# Patient Record
Sex: Female | Born: 1955 | Race: White | Hispanic: No | Marital: Married | Smoking: Former smoker
Health system: Southern US, Community
[De-identification: ages and names within clinical notes are randomized; demographics above are authoritative.]

## PROBLEM LIST (undated history)

## (undated) DIAGNOSIS — R011 Cardiac murmur, unspecified: Secondary | ICD-10-CM

## (undated) DIAGNOSIS — E785 Hyperlipidemia, unspecified: Secondary | ICD-10-CM

## (undated) DIAGNOSIS — IMO0001 Reserved for inherently not codable concepts without codable children: Secondary | ICD-10-CM

## (undated) DIAGNOSIS — Z5189 Encounter for other specified aftercare: Secondary | ICD-10-CM

## (undated) DIAGNOSIS — R51 Headache: Secondary | ICD-10-CM

## (undated) DIAGNOSIS — F32A Depression, unspecified: Secondary | ICD-10-CM

## (undated) DIAGNOSIS — N189 Chronic kidney disease, unspecified: Secondary | ICD-10-CM

## (undated) DIAGNOSIS — Z87442 Personal history of urinary calculi: Secondary | ICD-10-CM

## (undated) DIAGNOSIS — C801 Malignant (primary) neoplasm, unspecified: Secondary | ICD-10-CM

## (undated) DIAGNOSIS — M1990 Unspecified osteoarthritis, unspecified site: Secondary | ICD-10-CM

## (undated) DIAGNOSIS — I341 Nonrheumatic mitral (valve) prolapse: Secondary | ICD-10-CM

## (undated) DIAGNOSIS — I351 Nonrheumatic aortic (valve) insufficiency: Secondary | ICD-10-CM

## (undated) DIAGNOSIS — F329 Major depressive disorder, single episode, unspecified: Secondary | ICD-10-CM

## (undated) HISTORY — PX: EYE SURGERY: SHX253

## (undated) HISTORY — DX: Nonrheumatic mitral (valve) prolapse: I34.1

## (undated) HISTORY — DX: Depression, unspecified: F32.A

## (undated) HISTORY — DX: Hyperlipidemia, unspecified: E78.5

## (undated) HISTORY — PX: TONSILLECTOMY: SUR1361

## (undated) HISTORY — DX: Malignant (primary) neoplasm, unspecified: C80.1

## (undated) HISTORY — DX: Nonrheumatic aortic (valve) insufficiency: I35.1

## (undated) HISTORY — DX: Reserved for inherently not codable concepts without codable children: IMO0001

## (undated) HISTORY — DX: Personal history of urinary calculi: Z87.442

## (undated) HISTORY — DX: Major depressive disorder, single episode, unspecified: F32.9

## (undated) HISTORY — PX: BLEPHAROPLASTY: SUR158

## (undated) HISTORY — DX: Encounter for other specified aftercare: Z51.89

---

## 1984-08-20 HISTORY — PX: BREAST SURGERY: SHX581

## 1992-08-20 HISTORY — PX: BREAST SURGERY: SHX581

## 2007-06-11 ENCOUNTER — Ambulatory Visit (HOSPITAL_COMMUNITY): Admission: RE | Admit: 2007-06-11 | Discharge: 2007-06-11 | Payer: Self-pay | Admitting: Obstetrics and Gynecology

## 2007-10-06 ENCOUNTER — Emergency Department (HOSPITAL_COMMUNITY): Admission: EM | Admit: 2007-10-06 | Discharge: 2007-10-06 | Payer: Self-pay | Admitting: Emergency Medicine

## 2007-10-15 ENCOUNTER — Emergency Department (HOSPITAL_COMMUNITY): Admission: EM | Admit: 2007-10-15 | Discharge: 2007-10-16 | Payer: Self-pay | Admitting: Family Medicine

## 2007-10-17 ENCOUNTER — Ambulatory Visit: Payer: Self-pay | Admitting: Infectious Diseases

## 2007-10-17 DIAGNOSIS — B731 Onchocerciasis without eye disease: Secondary | ICD-10-CM | POA: Insufficient documentation

## 2007-10-17 DIAGNOSIS — B009 Herpesviral infection, unspecified: Secondary | ICD-10-CM | POA: Insufficient documentation

## 2007-10-17 DIAGNOSIS — D069 Carcinoma in situ of cervix, unspecified: Secondary | ICD-10-CM | POA: Insufficient documentation

## 2007-10-17 LAB — CONVERTED CEMR LAB
Basophils Absolute: 0 10*3/uL (ref 0.0–0.1)
Basophils Relative: 0 % (ref 0–1)
Eosinophils Absolute: 1.6 10*3/uL — ABNORMAL HIGH (ref 0.0–0.7)
Eosinophils Relative: 20 % — ABNORMAL HIGH (ref 0–5)
HCT: 40 % (ref 36.0–46.0)
Hemoglobin: 13.4 g/dL (ref 12.0–15.0)
Lymphocytes Relative: 30 % (ref 12–46)
Lymphs Abs: 2.4 10*3/uL (ref 0.7–4.0)
MCHC: 33.4 g/dL (ref 30.0–36.0)
MCV: 92 fL (ref 78.0–100.0)
Monocytes Absolute: 0.3 10*3/uL (ref 0.1–1.0)
Monocytes Relative: 4 % (ref 3–12)
Neutro Abs: 3.6 10*3/uL (ref 1.7–7.7)
Neutrophils Relative %: 46 % (ref 43–77)
Platelets: 320 10*3/uL (ref 150–400)
RBC: 4.35 M/uL (ref 3.87–5.11)
RDW: 13.3 % (ref 11.5–15.5)
WBC: 8 10*3/uL (ref 4.0–10.5)

## 2007-10-31 ENCOUNTER — Telehealth: Payer: Self-pay | Admitting: Infectious Diseases

## 2007-10-31 ENCOUNTER — Ambulatory Visit: Payer: Self-pay | Admitting: Surgery

## 2007-10-31 ENCOUNTER — Ambulatory Visit (HOSPITAL_COMMUNITY): Admission: RE | Admit: 2007-10-31 | Discharge: 2007-10-31 | Payer: Self-pay | Admitting: Internal Medicine

## 2007-10-31 ENCOUNTER — Encounter (INDEPENDENT_AMBULATORY_CARE_PROVIDER_SITE_OTHER): Payer: Self-pay | Admitting: Internal Medicine

## 2010-09-19 NOTE — Progress Notes (Signed)
Summary: pt. requesting tel. call from MD and f/u lab work  Phone Note Call from Patient Call back at cell [phone]   Caller: Patient Reason for Call: Talk to Doctor Summary of Call: Pt.  upset about not recieving a call back or email from MD to discuss lab results.  [place]. still in [place]  Pt. asking about f/u lab tests and whether she needs to be seen by MD.  She reports that her arm is still edematous.  She has approx. one week of medication remaining.  Please advise ASAP. Initial call taken by: Jennet Maduro RN,  October 31, 2007 12:16 PM  Follow-up for Phone Call        Dr Ninetta Lights will send pt email on Monday. I have scheduled pt return OV in the overflow clinic for November 14, 2007 @ 9:30 am. Pt has not been informed. Will wait until Dr Dorian Heckle speaks with pt.    Additional Follow-up for Phone Call Additional follow up Details #1::        attempted to call patient. no answer.  do not reschedule her for appt. as she has been abusive to staff on 2 different occasions

## 2010-09-19 NOTE — Assessment & Plan Note (Signed)
Summary: 2:00 jh nw pt recsincomputer l.arm swelling/kam   Chief Complaint:  woke up with arm pain 10/07/07.  History of Present Illness: 55 yo F with hx of L eye droopy and L arm feeling sleepy. She noticed 10-07-07  that her arm was swollen and that there was a lump on her L forearm (knot).  was seen in ER and had CT and MRI of brain both NL. Had a CBC showing a high Eosinophil count (19%). Was d/c home. 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  Was vaccinated for hepA/B/typhoid/diphtheria/rabies/yellow fever/mening vaccines. Took malaria prophylaxis.  10-16-07 arm was more swollen and now both sides swollen (prev dorsum only). now pruritic.  this has decreased since then. returned to ER (Eos now 16%).  states she is hypervigilant about washing her food, swims in fresh water as well as in [employer]. multiple insect bites.   states her arm si still swollen and still painful.   Currently living in [place] since October 2007.     Current Allergies (reviewed today): No known allergies    Family History:    mother recently deceased due to CAD/valve replacement. eating d/o.     Family History of Cervical cancer  Social History:    Married    Never Smoked    Alcohol use-no   Risk Factors:  Tobacco use:  never Passive smoke exposure:  no Caffeine use:  3 drinks per day Alcohol use:  no Exercise:  yes    Times per week:  2    Type:  gym Seatbelt use:  100 %   Review of Systems       no LAN, no change in BM, no cough or sob,  previous botox injections (has leakage from her supra-orbital area similar to this afterwards).    Vital Signs:  Patient Profile:   55 Years Old Female Height:     63 inches (160.02 cm) Weight:      120.4 pounds (54.73 kg) BMI:     21.40 Temp:     97.4 degrees F (36.33 degrees C) oral Pulse rate:   79 / minute BP sitting:   111 / 73  Pt. in pain?   yes    Location:   left arm    Intensity:    3    Type:       soreness  Vitals Entered By: Jennet Maduro RN (October 17, 2007 2:22 PM)              Is Patient Diabetic? No Nutritional Status BMI of 19 -24 = normal Nutritional Status Detail appetite "good"  Have you ever been in a relationship where you felt threatened, hurt or afraid?not asked sister present   Does patient need assistance? Functional Status Self care Ambulation Normal     Physical Exam  General:     well-developed, well-nourished, and well-hydrated.   Eyes:     pupils equal, pupils round, and pupils reactive to light.   Mouth:     pharynx pink and moist and no exudates.   Neck:     no masses.   Lungs:     normal respiratory effort and normal breath sounds.   Heart:     normal rate, regular rhythm, and no murmur.   Abdomen:     soft, non-tender, and normal bowel sounds.   Skin:     mild erythema on L forearm.  Cervical Nodes:  no anterior cervical adenopathy and no posterior cervical adenopathy.   Axillary Nodes:     no R axillary adenopathy and no L axillary adenopathy.      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 at this point this is an empiric dx. I will check a blood smear on her, and strongyloides as well as antibody test for filiarisis. I will start her on albendazole for the next 3 weeks. the recommended therapy would be to treat her with this medication or DEC however DEC is only available from the [facility]. she will need a repeat cbc at the end of this course. Since she is returning to [place] next week, I will be in touch with her via e-mail.   CC: Rodrigo Ran, MD Orders: T-CBC w/Diff [phone]) T- * Misc. Laboratory test [phone])   Complete Medication List: 1)  Prempro 0.625-2.5 Mg Tabs (Conj estrog-medroxyprogest ace) 2)  Ativan 1 Mg Tabs (Lorazepam) .... Take 1 tablet by mouth at bedtime 3)  Albenza 200 Mg Tabs (Albendazole) .... Take 1  tablet by mouth two times a day     ]

## 2010-09-19 NOTE — Miscellaneous (Signed)
Summary: Patient consent  Patient consent   Imported By: Louretta Parma 11/07/2007 16:51:26  _____________________________________________________________________  External Attachment:    Type:   Image     Comment:   External Document

## 2010-10-24 ENCOUNTER — Encounter (INDEPENDENT_AMBULATORY_CARE_PROVIDER_SITE_OTHER): Payer: Self-pay | Admitting: *Deleted

## 2010-10-31 NOTE — Letter (Signed)
Summary: Pre Visit Letter Revised  [facility]  [address] [place], [place] [address]   Phone: [phone]  Fax: [phone]        10/24/2010 MRN: [id number] [facility] [address] [place], [place]  [address]             Procedure Date:  12-11-10           Direct Colon--Dr. Juanda Chance   Welcome to the Gastroenterology Division at [facility].    You are scheduled to see a nurse for your pre-procedure visit on 11-27-10 at 8:30a.m. on the 3rd floor at [facility], [address]  We ask that you try to arrive at our office 15 minutes prior to your appointment time to allow for check-in.  Please take a minute to review the attached form.  If you answer "Yes" to one or more of the questions on the first page, we ask that you call the person listed at your earliest opportunity.  If you answer "No" to all of the questions, please complete the rest of the form and bring it to your appointment.    Your nurse visit will consist of discussing your medical and surgical history, your immediate family medical history, and your medications.   If you are unable to list all of your medications on the form, please bring the medication bottles to your appointment and we will list them.  We will need to be aware of both prescribed and over the counter drugs.  We will need to know exact dosage information as well.    Please be prepared to read and sign documents such as consent forms, a financial agreement, and acknowledgement forms.  If necessary, and with your consent, a friend or relative is welcome to sit-in on the nurse visit with you.  Please bring your insurance card so that we may make a copy of it.  If your insurance requires a referral to see a specialist, please bring your referral form from your primary care physician.  No co-pay is required for this nurse visit.     If you cannot keep your appointment, please call [phone] to cancel or reschedule prior to  your appointment date.  This allows [place] the opportunity to schedule an appointment for another patient in need of care.    Thank you for choosing [facility] for your medical needs.  We appreciate the opportunity to care for you.  Please visit [place] at our website  to learn more about our practice.  Sincerely, The Gastroenterology Division

## 2010-11-01 ENCOUNTER — Encounter: Payer: Self-pay | Admitting: *Deleted

## 2010-11-03 ENCOUNTER — Encounter: Payer: Self-pay | Admitting: Internal Medicine

## 2010-11-07 NOTE — Letter (Signed)
Summary: Moviprep Instructions  [facility]  [address]   [place], [place] [address]   Phone: [phone]  Fax: [phone]       Allison Guerrero    [birth year]    MRN: [id number]        Procedure Day Dorna Bloom: Lenor Coffin  11/09/10     Arrival Time: 12:30 p.m.     Procedure Time: 1:30 p.m.     Location of Procedure:                    x  [facility] (4th Floor)                        PREPARATION FOR COLONOSCOPY WITH MOVIPREP   Starting 5 days prior to your procedure 11-04-10 do not eat nuts, seeds, popcorn, corn, beans, peas,  salads, or any raw vegetables.  Do not take any fiber supplements (e.g. Metamucil, Citrucel, and Benefiber).  THE DAY BEFORE YOUR PROCEDURE         DATE: 11-08-10   DAY: WEDNESDAY  1.  Drink clear liquids the entire day-NO SOLID FOOD  2.  Do not drink anything colored red or purple.  Avoid juices with pulp.  No orange juice.  3.  Drink at least 64 oz. (8 glasses) of fluid/clear liquids during the day to prevent dehydration and help the prep work efficiently.  CLEAR LIQUIDS INCLUDE: Water Jello Ice Popsicles Tea (sugar ok, no milk/cream) Powdered fruit flavored drinks Coffee (sugar ok, no milk/cream) Gatorade Juice: apple, white grape, white cranberry  Lemonade Clear bullion, consomm, broth Carbonated beverages (any kind) Strained chicken noodle soup Hard Candy                             4.  In the morning, mix first dose of MoviPrep solution:    Empty 1 Pouch A and 1 Pouch B into the disposable container    Add lukewarm drinking water to the top line of the container. Mix to dissolve    Refrigerate (mixed solution should be used within 24 hrs)  5.  Begin drinking the prep at 5:00 p.m. The MoviPrep container is divided by 4 marks.   Every 15 minutes drink the solution down to the next mark (approximately 8 oz) until the full liter is complete.   6.  Follow completed prep with 16 oz of clear liquid of your  choice (Nothing red or purple).  Continue to drink clear liquids until bedtime.  7.  Before going to bed, mix second dose of MoviPrep solution:    Empty 1 Pouch A and 1 Pouch B into the disposable container    Add lukewarm drinking water to the top line of the container. Mix to dissolve    Refrigerate  THE DAY OF YOUR PROCEDURE      DATE: 11-09-10  DAY: THURSDAY  Beginning at 8:30AM  (5 hours before procedure):         1. Every 15 minutes, drink the solution down to the next mark (approx 8 oz) until the full liter is complete.  2. Follow completed prep with 16 oz. of clear liquid of your choice.    3. You may drink clear liquids until 11:30AM (2 HOURS BEFORE PROCEDURE).   MEDICATION INSTRUCTIONS  Unless otherwise instructed, you should take regular prescription medications with a small sip of water   as early as possible the morning  of your procedure.         OTHER INSTRUCTIONS  You will need a responsible adult at least 56 years of age to accompany you and drive you home.   This person must remain in the waiting room during your procedure.  Wear loose fitting clothing that is easily removed.  Leave jewelry and other valuables at home.  However, you may wish to bring a book to read or  an iPod/MP3 player to listen to music as you wait for your procedure to start.  Remove all body piercing jewelry and leave at home.  Total time from sign-in until discharge is approximately 2-3 hours.  You should go home directly after your procedure and rest.  You can resume normal activities the  day after your procedure.  The day of your procedure you should not:   Drive   Make legal decisions   Operate machinery   Drink alcohol   Return to work  You will receive specific instructions about eating, activities and medications before you leave.    The above instructions have been reviewed and explained to me by   [name] RN  November 03, 2010 8:55 AM     I fully  understand and can verbalize these instructions 55 _____________________________ Date _________

## 2010-11-07 NOTE — Miscellaneous (Signed)
Summary: [facility] Previsit/prep..  Clinical Lists Changes  Medications: Added new medication of MOVIPREP 100 GM  SOLR (PEG-KCL-NACL-NASULF-NA ASC-C) As per prep instructions. - Signed Rx of MOVIPREP 100 GM  SOLR (PEG-KCL-NACL-NASULF-NA ASC-C) As per prep instructions.;  #1 x 0;  Signed;  Entered by: [name] RN;  Authorized by: Hart Carwin MD;  Method used: Electronically to [facility] # [phone]*, [address], [place], [place]  [address], Ph: [phone], Fax: [phone] Observations: Added new observation of NKA: T (11/03/2010 8:22)    Prescriptions: MOVIPREP 100 GM  SOLR (PEG-KCL-NACL-NASULF-NA ASC-C) As per prep instructions.  #1 x 0   Entered by:   [name] RN   Authorized by:   Hart Carwin MD   Signed by:   [name] RN on 11/03/2010   Method used:   Electronically to        [facility] # [phone]* (retail)       [address]       [place], [place]  [address]       Ph: [phone]       Fax: [phone]   RxID:   [id number]

## 2010-11-08 ENCOUNTER — Encounter: Payer: Self-pay | Admitting: *Deleted

## 2010-11-09 ENCOUNTER — Encounter: Payer: Self-pay | Admitting: Internal Medicine

## 2010-11-09 ENCOUNTER — Ambulatory Visit (AMBULATORY_SURGERY_CENTER): Payer: Managed Care, Other (non HMO) | Admitting: Internal Medicine

## 2010-11-09 VITALS — BP 120/68 | HR 71 | Temp 97.5°F | Resp 20

## 2010-11-09 DIAGNOSIS — D126 Benign neoplasm of colon, unspecified: Secondary | ICD-10-CM

## 2010-11-09 DIAGNOSIS — K635 Polyp of colon: Secondary | ICD-10-CM

## 2010-11-09 DIAGNOSIS — Z1211 Encounter for screening for malignant neoplasm of colon: Secondary | ICD-10-CM

## 2010-11-09 NOTE — Patient Instructions (Signed)
Please review d/c instructions. 1-small polyp High Fiber diet Recall colonoscopy in 10 years

## 2010-11-10 ENCOUNTER — Telehealth: Payer: Self-pay | Admitting: *Deleted

## 2010-11-10 NOTE — Telephone Encounter (Signed)
No answer, left message

## 2010-11-14 ENCOUNTER — Other Ambulatory Visit: Payer: Self-pay | Admitting: Internal Medicine

## 2010-11-14 ENCOUNTER — Encounter: Payer: Self-pay | Admitting: Internal Medicine

## 2010-11-16 NOTE — Procedures (Addendum)
Summary: Colonoscopy  Patient: Allison Guerrero Note: All result statuses are Final unless otherwise noted.  Tests: (1) Colonoscopy (COL)   COL Colonoscopy           DONE     [facility]     [address]     [place], [place]  [address]          COLONOSCOPY PROCEDURE REPORT          PATIENT:  [name], [name]  MR#:  [id number]     BIRTHDATE:  Apr 15, 1956, 54 yrs. old  GENDER:  female     ENDOSCOPIST:  Hedwig Morton. Juanda Chance, MD     REF. BY:  Juline Patch, M.D.     PROCEDURE DATE:  11/09/2010     PROCEDURE:  Colonoscopy 19147     ASA CLASS:  Class I     INDICATIONS:  colorectal cancer screening, average risk     MEDICATIONS:   Versed 9 mg, Fentanyl 75 mcg          DESCRIPTION OF PROCEDURE:   After the risks benefits and     alternatives of the procedure were thoroughly explained, informed     consent was obtained.  Digital rectal exam was performed and     revealed no rectal masses.   The LB [id number] endoscope     was introduced through the anus and advanced to the cecum, which     was identified by both the appendix and ileocecal valve, without     limitations.  The quality of the prep was excellent, using     MoviPrep.  The instrument was then slowly withdrawn as the colon     was fully examined.     <<PROCEDUREIMAGES>>          FINDINGS:  A diminutive polyp was found. at 15 cm, 2mm polyp The     polyp was removed using cold biopsy forceps (see image5).  This     was otherwise a normal examination of the colon (see image4,     image3, image2, and image1).   Retroflexed views in the rectum     revealed no abnormalities.    The scope was then withdrawn from     the patient and the procedure completed.          COMPLICATIONS:  None     ENDOSCOPIC IMPRESSION:     1) Diminutive polyp     2) Otherwise normal examination     RECOMMENDATIONS:     1) Await pathology results     2) High fiber diet.     REPEAT EXAM:  In 10 year(s) for.       ______________________________     Hedwig Morton. Juanda Chance, MD          CC:          n.     eSIGNED:   Hedwig Morton. Maitri Schnoebelen at 11/09/2010 02:13 PM          Roger Kill, [id number]  Note: An exclamation mark (!) indicates a result that was not dispersed into the flowsheet. Document Creation Date: 11/15/2010 11:38 AM _______________________________________________________________________  (1) Order result status: Final Collection or observation date-time: 11/09/2010 14:03 Requested date-time:  Receipt date-time:  Reported date-time:  Referring Physician:   Ordering Physician: Lina Sar [phone]) Specimen Source:  Source: Launa Grill Order Number: [id number] Lab site:

## 2010-12-11 ENCOUNTER — Other Ambulatory Visit: Payer: Self-pay | Admitting: Internal Medicine

## 2011-05-11 LAB — CBC
HCT: 38.7
HCT: 41.1
Hemoglobin: 13.1
Hemoglobin: 13.7
MCHC: 33.4
MCHC: 33.9
MCV: 91.7
MCV: 91.8
Platelets: 331
Platelets: 339
RBC: 4.22
RBC: 4.48
RDW: 13.3
RDW: 13.7
WBC: 10.8 — ABNORMAL HIGH
WBC: 7.8

## 2011-05-11 LAB — DIFFERENTIAL
Basophils Absolute: 0
Basophils Absolute: 0
Basophils Relative: 0
Basophils Relative: 0
Eosinophils Absolute: 1.5 — ABNORMAL HIGH
Eosinophils Absolute: 1.7 — ABNORMAL HIGH
Eosinophils Relative: 16 — ABNORMAL HIGH
Eosinophils Relative: 19 — ABNORMAL HIGH
Lymphocytes Relative: 34
Lymphocytes Relative: 42
Lymphs Abs: 3.3
Lymphs Abs: 3.6
Monocytes Absolute: 0.4
Monocytes Absolute: 0.4
Monocytes Relative: 4
Monocytes Relative: 5
Neutro Abs: 2.7
Neutro Abs: 5.1
Neutrophils Relative %: 34 — ABNORMAL LOW
Neutrophils Relative %: 47

## 2011-05-11 LAB — I-STAT 8, (EC8 V) (CONVERTED LAB)
Acid-Base Excess: 1
Acid-base deficit: 1
BUN: 10
BUN: 26 — ABNORMAL HIGH
Bicarbonate: 21.8
Bicarbonate: 25.9 — ABNORMAL HIGH
Chloride: 105
Chloride: 107
Glucose, Bld: 123 — ABNORMAL HIGH
Glucose, Bld: 152 — ABNORMAL HIGH
HCT: 41
HCT: 59 — ABNORMAL HIGH
Hemoglobin: 13.9
Hemoglobin: 20.1 — ABNORMAL HIGH
Operator id: 294501
Operator id: 294501
Potassium: 3.7
Potassium: 3.8
Sodium: 136
Sodium: 141
TCO2: 23
TCO2: 27
pCO2, Ven: 27.7 — ABNORMAL LOW
pCO2, Ven: 49.3
pH, Ven: 7.328 — ABNORMAL HIGH
pH, Ven: 7.505 — ABNORMAL HIGH

## 2011-05-11 LAB — D-DIMER, QUANTITATIVE: D-Dimer, Quant: 0.25

## 2011-05-11 LAB — POCT I-STAT CREATININE
Creatinine, Ser: 0.7
Creatinine, Ser: 1.2
Operator id: 294501
Operator id: 294501

## 2011-08-21 HISTORY — PX: LITHOTRIPSY: SUR834

## 2011-08-21 HISTORY — PX: BREAST SURGERY: SHX581

## 2011-10-16 ENCOUNTER — Other Ambulatory Visit (HOSPITAL_COMMUNITY): Payer: Self-pay | Admitting: Internal Medicine

## 2011-10-16 DIAGNOSIS — Z1231 Encounter for screening mammogram for malignant neoplasm of breast: Secondary | ICD-10-CM

## 2012-03-03 ENCOUNTER — Other Ambulatory Visit: Payer: Self-pay | Admitting: Registered Nurse

## 2012-03-03 ENCOUNTER — Other Ambulatory Visit (HOSPITAL_COMMUNITY)
Admission: RE | Admit: 2012-03-03 | Discharge: 2012-03-03 | Disposition: A | Discharge status: Home / Self Care | Payer: 59 | Source: Ambulatory Visit | Attending: Internal Medicine | Admitting: Internal Medicine

## 2012-03-03 DIAGNOSIS — Z01419 Encounter for gynecological examination (general) (routine) without abnormal findings: Secondary | ICD-10-CM | POA: Insufficient documentation

## 2012-10-06 ENCOUNTER — Other Ambulatory Visit: Payer: Self-pay | Admitting: Orthopedic Surgery

## 2012-10-06 DIAGNOSIS — M542 Cervicalgia: Secondary | ICD-10-CM

## 2012-10-06 DIAGNOSIS — M25512 Pain in left shoulder: Secondary | ICD-10-CM

## 2012-10-07 ENCOUNTER — Ambulatory Visit
Admission: RE | Admit: 2012-10-07 | Discharge: 2012-10-07 | Disposition: A | Discharge status: Home / Self Care | Payer: 59 | Source: Ambulatory Visit | Attending: Orthopedic Surgery | Admitting: Orthopedic Surgery

## 2012-10-07 DIAGNOSIS — M542 Cervicalgia: Secondary | ICD-10-CM

## 2012-10-07 DIAGNOSIS — M25512 Pain in left shoulder: Secondary | ICD-10-CM

## 2013-03-24 ENCOUNTER — Other Ambulatory Visit: Payer: Self-pay

## 2013-03-24 DIAGNOSIS — Z01419 Encounter for gynecological examination (general) (routine) without abnormal findings: Secondary | ICD-10-CM | POA: Insufficient documentation

## 2013-03-26 ENCOUNTER — Other Ambulatory Visit: Payer: Self-pay | Admitting: Orthopedic Surgery

## 2013-04-13 ENCOUNTER — Other Ambulatory Visit: Payer: Self-pay | Admitting: Neurosurgery

## 2013-04-21 ENCOUNTER — Encounter (HOSPITAL_COMMUNITY): Payer: Self-pay

## 2013-04-22 ENCOUNTER — Other Ambulatory Visit (HOSPITAL_COMMUNITY)
Admission: RE | Admit: 2013-04-22 | Discharge: 2013-04-22 | Disposition: A | Discharge status: Home / Self Care | Payer: 59 | Source: Ambulatory Visit | Attending: Internal Medicine | Admitting: Internal Medicine

## 2013-04-23 ENCOUNTER — Inpatient Hospital Stay (HOSPITAL_COMMUNITY): Admission: RE | Admit: 2013-04-23 | Payer: 59 | Source: Ambulatory Visit

## 2013-04-27 ENCOUNTER — Other Ambulatory Visit: Payer: Self-pay | Admitting: Orthopedic Surgery

## 2013-04-28 ENCOUNTER — Ambulatory Visit (HOSPITAL_COMMUNITY)
Admission: RE | Admit: 2013-04-28 | Discharge: 2013-04-28 | Disposition: A | Discharge status: Home / Self Care | Payer: 59 | Source: Ambulatory Visit | Attending: Orthopedic Surgery | Admitting: Orthopedic Surgery

## 2013-04-28 ENCOUNTER — Encounter (HOSPITAL_COMMUNITY): Payer: Self-pay

## 2013-04-28 ENCOUNTER — Encounter (HOSPITAL_COMMUNITY)
Admission: RE | Admit: 2013-04-28 | Discharge: 2013-04-28 | Disposition: A | Discharge status: Home / Self Care | Payer: 59 | Source: Ambulatory Visit | Attending: Orthopedic Surgery | Admitting: Orthopedic Surgery

## 2013-04-28 DIAGNOSIS — Z01812 Encounter for preprocedural laboratory examination: Secondary | ICD-10-CM | POA: Insufficient documentation

## 2013-04-28 DIAGNOSIS — E785 Hyperlipidemia, unspecified: Secondary | ICD-10-CM | POA: Insufficient documentation

## 2013-04-28 DIAGNOSIS — Z01818 Encounter for other preprocedural examination: Secondary | ICD-10-CM | POA: Insufficient documentation

## 2013-04-28 DIAGNOSIS — M5030 Other cervical disc degeneration, unspecified cervical region: Secondary | ICD-10-CM | POA: Insufficient documentation

## 2013-04-28 DIAGNOSIS — Z01810 Encounter for preprocedural cardiovascular examination: Secondary | ICD-10-CM | POA: Insufficient documentation

## 2013-04-28 HISTORY — DX: Unspecified osteoarthritis, unspecified site: M19.90

## 2013-04-28 HISTORY — DX: Cardiac murmur, unspecified: R01.1

## 2013-04-28 HISTORY — DX: Chronic kidney disease, unspecified: N18.9

## 2013-04-28 HISTORY — DX: Headache: R51

## 2013-04-28 LAB — URINALYSIS, ROUTINE W REFLEX MICROSCOPIC
Bilirubin Urine: NEGATIVE
Glucose, UA: NEGATIVE mg/dL
Hgb urine dipstick: NEGATIVE
Ketones, ur: NEGATIVE mg/dL
Leukocytes, UA: NEGATIVE
Nitrite: NEGATIVE
Protein, ur: NEGATIVE mg/dL
Specific Gravity, Urine: 1.011 (ref 1.005–1.030)
Urobilinogen, UA: 0.2 mg/dL (ref 0.0–1.0)
pH: 7 (ref 5.0–8.0)

## 2013-04-28 LAB — COMPREHENSIVE METABOLIC PANEL
ALT: 17 U/L (ref 0–35)
AST: 18 U/L (ref 0–37)
Albumin: 3.8 g/dL (ref 3.5–5.2)
Alkaline Phosphatase: 50 U/L (ref 39–117)
BUN: 12 mg/dL (ref 6–23)
CO2: 25 mEq/L (ref 19–32)
Calcium: 9.5 mg/dL (ref 8.4–10.5)
Chloride: 108 mEq/L (ref 96–112)
Creatinine, Ser: 0.82 mg/dL (ref 0.50–1.10)
GFR calc Af Amer: 90 mL/min — ABNORMAL LOW (ref >90)
GFR calc non Af Amer: 77 mL/min — ABNORMAL LOW (ref >90)
Glucose, Bld: 66 mg/dL — ABNORMAL LOW (ref 70–99)
Potassium: 4 mEq/L (ref 3.5–5.1)
Sodium: 142 mEq/L (ref 135–145)
Total Bilirubin: <0.1 mg/dL — ABNORMAL LOW (ref 0.3–1.2)
Total Protein: 6.5 g/dL (ref 6.0–8.3)

## 2013-04-28 LAB — CBC WITH DIFFERENTIAL/PLATELET
Basophils Absolute: 0.1 10*3/uL (ref 0.0–0.1)
Basophils Relative: 1 % (ref 0–1)
Eosinophils Absolute: 0.3 10*3/uL (ref 0.0–0.7)
Eosinophils Relative: 5 % (ref 0–5)
HCT: 39.4 % (ref 36.0–46.0)
Hemoglobin: 13.6 g/dL (ref 12.0–15.0)
Lymphocytes Relative: 36 % (ref 12–46)
Lymphs Abs: 2.1 10*3/uL (ref 0.7–4.0)
MCH: 31.6 pg (ref 26.0–34.0)
MCHC: 34.5 g/dL (ref 30.0–36.0)
MCV: 91.4 fL (ref 78.0–100.0)
Monocytes Absolute: 0.2 10*3/uL (ref 0.1–1.0)
Monocytes Relative: 4 % (ref 3–12)
Neutro Abs: 3.1 10*3/uL (ref 1.7–7.7)
Neutrophils Relative %: 55 % (ref 43–77)
Platelets: 297 10*3/uL (ref 150–400)
RBC: 4.31 MIL/uL (ref 3.87–5.11)
RDW: 13.4 % (ref 11.5–15.5)
WBC: 5.7 10*3/uL (ref 4.0–10.5)

## 2013-04-28 LAB — TYPE AND SCREEN
ABO/RH(D): A POS
Antibody Screen: NEGATIVE

## 2013-04-28 LAB — SURGICAL PCR SCREEN
MRSA, PCR: NEGATIVE
Staphylococcus aureus: POSITIVE — AB

## 2013-04-28 LAB — APTT: aPTT: 29 seconds (ref 24–37)

## 2013-04-28 LAB — PROTIME-INR
INR: 1.09 (ref 0.00–1.49)
Prothrombin Time: 13.9 seconds (ref 11.6–15.2)

## 2013-04-28 LAB — ABO/RH: ABO/RH(D): A POS

## 2013-04-28 MED ORDER — CEFAZOLIN SODIUM-DEXTROSE 2-3 GM-% IV SOLR
2.0000 g | INTRAVENOUS | Status: AC
  Administered 2013-04-29: 2 g via INTRAVENOUS
  Filled 2013-04-28: qty 50

## 2013-04-28 NOTE — Pre-Procedure Instructions (Signed)
Allison Guerrero  04/28/2013     Your procedure is scheduled on:  Wednesday April 29, 2013  Report to [facility] at 0630 AM.  Call this number if you have problems the morning of surgery: [phone]   Remember:   Do not eat food or drink liquids after midnight.   Take these medicines the morning of surgery with A SIP OF WATER:  Hydrocodone-Acetaminophen if needed for pain   Do not wear jewelry, make-up or nail polish.  Do not wear lotions, powders, or perfumes. You may wear deodorant.  Do not shave 48 hours prior to surgery. Men may shave face and neck.  Do not bring valuables to the hospital.  [facility] is not responsible for any belongings or valuables.  Contacts, dentures or bridgework may not be worn into surgery.  Leave suitcase in the car. After surgery it may be brought to your room.  For patients admitted to the hospital, checkout time is 11:00 AM the day of  discharge.   Patients discharged the day of surgery will not be allowed to drive home.  Name an  Special Instructions: Incentive Spirometry - Practice and bring it with you on the day of surgery. Shower using CHG 2 nights before surgery and the night before surgery.  If you shower the day of surgery use CHG.  Use special wash - you have one bottle of CHG for all showers.  You should use approximately 1/3 of the bottle for each shower.   Please read over the following fact sheets that you were given: Pain Booklet, Coughing and Deep Breathing, Blood Transfusion Information, MRSA Information and Surgical Site Infection Prevention

## 2013-04-29 ENCOUNTER — Encounter (HOSPITAL_COMMUNITY): Payer: Self-pay | Admitting: Vascular Surgery

## 2013-04-29 ENCOUNTER — Encounter (HOSPITAL_COMMUNITY): Payer: Self-pay | Admitting: Certified Registered Nurse Anesthetist

## 2013-04-29 ENCOUNTER — Inpatient Hospital Stay (HOSPITAL_COMMUNITY)
Admission: RE | Admit: 2013-04-29 | Discharge: 2013-05-01 | DRG: 473 | Disposition: A | Discharge status: Home Health | Payer: 59 | Source: Ambulatory Visit | Attending: Orthopedic Surgery | Admitting: Orthopedic Surgery

## 2013-04-29 ENCOUNTER — Encounter (HOSPITAL_COMMUNITY)
Admission: RE | Disposition: A | Discharge status: Home Health | Payer: Self-pay | Source: Ambulatory Visit | Attending: Orthopedic Surgery

## 2013-04-29 ENCOUNTER — Ambulatory Visit (HOSPITAL_COMMUNITY): Payer: 59

## 2013-04-29 ENCOUNTER — Ambulatory Visit (HOSPITAL_COMMUNITY): Payer: 59 | Admitting: Certified Registered Nurse Anesthetist

## 2013-04-29 DIAGNOSIS — Z79899 Other long term (current) drug therapy: Secondary | ICD-10-CM

## 2013-04-29 DIAGNOSIS — I059 Rheumatic mitral valve disease, unspecified: Secondary | ICD-10-CM | POA: Diagnosis present

## 2013-04-29 DIAGNOSIS — M1990 Unspecified osteoarthritis, unspecified site: Secondary | ICD-10-CM | POA: Diagnosis present

## 2013-04-29 DIAGNOSIS — F3289 Other specified depressive episodes: Secondary | ICD-10-CM | POA: Diagnosis present

## 2013-04-29 DIAGNOSIS — F411 Generalized anxiety disorder: Secondary | ICD-10-CM | POA: Diagnosis present

## 2013-04-29 DIAGNOSIS — M5030 Other cervical disc degeneration, unspecified cervical region: Principal | ICD-10-CM | POA: Diagnosis present

## 2013-04-29 DIAGNOSIS — Z87891 Personal history of nicotine dependence: Secondary | ICD-10-CM

## 2013-04-29 DIAGNOSIS — F329 Major depressive disorder, single episode, unspecified: Secondary | ICD-10-CM | POA: Diagnosis present

## 2013-04-29 DIAGNOSIS — E785 Hyperlipidemia, unspecified: Secondary | ICD-10-CM | POA: Diagnosis present

## 2013-04-29 DIAGNOSIS — Z8541 Personal history of malignant neoplasm of cervix uteri: Secondary | ICD-10-CM

## 2013-04-29 HISTORY — PX: ANTERIOR CERVICAL DECOMP/DISCECTOMY FUSION: SHX1161

## 2013-04-29 SURGERY — ANTERIOR CERVICAL DECOMPRESSION/DISCECTOMY FUSION 2 LEVELS
Anesthesia: General | Site: Neck | Wound class: Clean

## 2013-04-29 MED ORDER — MUPIROCIN 2 % EX OINT
TOPICAL_OINTMENT | CUTANEOUS | Status: AC
  Filled 2013-04-29: qty 22

## 2013-04-29 MED ORDER — PRAVASTATIN SODIUM 40 MG PO TABS
80.0000 mg | ORAL_TABLET | Freq: Every day | ORAL | Status: DC
  Administered 2013-05-01: 80 mg via ORAL
  Filled 2013-04-29 (×3): qty 2

## 2013-04-29 MED ORDER — LACTATED RINGERS IV SOLN
INTRAVENOUS | Status: DC | PRN
  Administered 2013-04-29 (×2): via INTRAVENOUS

## 2013-04-29 MED ORDER — PHENOL 1.4 % MT LIQD: 1.0000 | OROMUCOSAL | Status: DC | PRN

## 2013-04-29 MED ORDER — PHENYLEPHRINE HCL 10 MG/ML IJ SOLN
INTRAMUSCULAR | Status: DC | PRN
  Administered 2013-04-29: 40 ug via INTRAVENOUS

## 2013-04-29 MED ORDER — GLYCOPYRROLATE 0.2 MG/ML IJ SOLN
INTRAMUSCULAR | Status: DC | PRN
  Administered 2013-04-29: 0.6 mg via INTRAVENOUS

## 2013-04-29 MED ORDER — CHLORHEXIDINE GLUCONATE CLOTH 2 % EX PADS
6.0000 | MEDICATED_PAD | Freq: Every day | CUTANEOUS | Status: DC
  Administered 2013-04-30: 6 via TOPICAL

## 2013-04-29 MED ORDER — MORPHINE SULFATE 2 MG/ML IJ SOLN
1.0000 mg | INTRAMUSCULAR | Status: DC | PRN
Start: 2013-04-29 — End: 2013-05-01
  Administered 2013-04-29 (×2): 2 mg via INTRAVENOUS
  Filled 2013-04-29 (×2): qty 1

## 2013-04-29 MED ORDER — OXYCODONE-ACETAMINOPHEN 5-325 MG PO TABS
1.0000 | ORAL_TABLET | ORAL | Status: DC | PRN
  Administered 2013-04-29: 2 via ORAL
  Administered 2013-04-30: 1 via ORAL
  Administered 2013-04-30 (×3): 2 via ORAL
  Administered 2013-04-30: 1 via ORAL
  Administered 2013-05-01: 2 via ORAL
  Filled 2013-04-29: qty 2
  Filled 2013-04-29: qty 1
  Filled 2013-04-29 (×5): qty 2

## 2013-04-29 MED ORDER — NON FORMULARY: Freq: Every day | Status: DC

## 2013-04-29 MED ORDER — OXYCODONE HCL 5 MG PO TABS
5.0000 mg | ORAL_TABLET | Freq: Once | ORAL | Status: DC | PRN
Start: 2013-04-29 — End: 2013-04-29

## 2013-04-29 MED ORDER — BUPIVACAINE-EPINEPHRINE 0.25% -1:200000 IJ SOLN
INTRAMUSCULAR | Status: DC | PRN
  Administered 2013-04-29: 3 mL

## 2013-04-29 MED ORDER — SUMATRIPTAN SUCCINATE 100 MG PO TABS: 100.0000 mg | ORAL_TABLET | ORAL | Status: DC | PRN

## 2013-04-29 MED ORDER — FENTANYL CITRATE 0.05 MG/ML IJ SOLN
INTRAMUSCULAR | Status: DC | PRN
  Administered 2013-04-29 (×2): 25 ug via INTRAVENOUS
  Administered 2013-04-29: 50 ug via INTRAVENOUS
  Administered 2013-04-29: 25 ug via INTRAVENOUS
  Administered 2013-04-29: 100 ug via INTRAVENOUS
  Administered 2013-04-29: 25 ug via INTRAVENOUS

## 2013-04-29 MED ORDER — SODIUM CHLORIDE 0.9 % IJ SOLN: 3.0000 mL | INTRAMUSCULAR | Status: DC | PRN

## 2013-04-29 MED ORDER — NEOSTIGMINE METHYLSULFATE 1 MG/ML IJ SOLN
INTRAMUSCULAR | Status: DC | PRN
  Administered 2013-04-29: 3 mg via INTRAVENOUS

## 2013-04-29 MED ORDER — POVIDONE-IODINE 7.5 % EX SOLN: Freq: Once | CUTANEOUS | Status: DC

## 2013-04-29 MED ORDER — HYDROMORPHONE HCL PF 1 MG/ML IJ SOLN
0.2500 mg | INTRAMUSCULAR | Status: DC | PRN
  Administered 2013-04-29: 0.5 mg via INTRAVENOUS

## 2013-04-29 MED ORDER — ACETAMINOPHEN 650 MG RE SUPP: 650.0000 mg | RECTAL | Status: DC | PRN

## 2013-04-29 MED ORDER — CALCIUM-VITAMIN D 500-200 MG-UNIT PO TABS
1.0000 | ORAL_TABLET | Freq: Two times a day (BID) | ORAL | Status: DC
  Administered 2013-04-30 – 2013-05-01 (×2): 1 via ORAL
  Filled 2013-04-29 (×6): qty 1

## 2013-04-29 MED ORDER — SENNOSIDES-DOCUSATE SODIUM 8.6-50 MG PO TABS: 1.0000 | ORAL_TABLET | Freq: Every evening | ORAL | Status: DC | PRN

## 2013-04-29 MED ORDER — TOPIRAMATE 25 MG PO TABS
50.0000 mg | ORAL_TABLET | Freq: Every day | ORAL | Status: DC
  Administered 2013-04-29 – 2013-05-01 (×3): 50 mg via ORAL
  Filled 2013-04-29 (×3): qty 2

## 2013-04-29 MED ORDER — DOCUSATE SODIUM 100 MG PO CAPS
100.0000 mg | ORAL_CAPSULE | Freq: Two times a day (BID) | ORAL | Status: DC
  Administered 2013-04-29 – 2013-05-01 (×5): 100 mg via ORAL
  Filled 2013-04-29 (×6): qty 1

## 2013-04-29 MED ORDER — SODIUM CHLORIDE 0.9 % IJ SOLN
3.0000 mL | Freq: Two times a day (BID) | INTRAMUSCULAR | Status: DC
Start: 2013-04-29 — End: 2013-05-01

## 2013-04-29 MED ORDER — BUPIVACAINE-EPINEPHRINE PF 0.25-1:200000 % IJ SOLN
INTRAMUSCULAR | Status: AC
  Filled 2013-04-29: qty 30

## 2013-04-29 MED ORDER — FLEET ENEMA 7-19 GM/118ML RE ENEM: 1.0000 | ENEMA | Freq: Once | RECTAL | Status: AC | PRN

## 2013-04-29 MED ORDER — LIDOCAINE HCL (CARDIAC) 20 MG/ML IV SOLN
INTRAVENOUS | Status: DC | PRN
  Administered 2013-04-29: 80 mg via INTRAVENOUS

## 2013-04-29 MED ORDER — CEFAZOLIN SODIUM 1-5 GM-% IV SOLN
1.0000 g | Freq: Three times a day (TID) | INTRAVENOUS | Status: AC
  Administered 2013-04-29 (×2): 1 g via INTRAVENOUS
  Filled 2013-04-29 (×2): qty 50

## 2013-04-29 MED ORDER — ROCURONIUM BROMIDE 100 MG/10ML IV SOLN
INTRAVENOUS | Status: DC | PRN
  Administered 2013-04-29: 10 mg via INTRAVENOUS
  Administered 2013-04-29: 50 mg via INTRAVENOUS

## 2013-04-29 MED ORDER — ALUM & MAG HYDROXIDE-SIMETH 200-200-20 MG/5ML PO SUSP: 30.0000 mL | Freq: Four times a day (QID) | ORAL | Status: DC | PRN

## 2013-04-29 MED ORDER — EPHEDRINE SULFATE 50 MG/ML IJ SOLN
INTRAMUSCULAR | Status: DC | PRN
  Administered 2013-04-29 (×3): 10 mg via INTRAVENOUS

## 2013-04-29 MED ORDER — ZOLPIDEM TARTRATE 5 MG PO TABS: 5.0000 mg | ORAL_TABLET | Freq: Every evening | ORAL | Status: DC | PRN

## 2013-04-29 MED ORDER — LORAZEPAM 1 MG PO TABS
2.0000 mg | ORAL_TABLET | Freq: Every day | ORAL | Status: DC
  Administered 2013-04-29 – 2013-04-30 (×2): 2 mg via ORAL
  Filled 2013-04-29 (×2): qty 2

## 2013-04-29 MED ORDER — ONDANSETRON HCL 4 MG/2ML IJ SOLN
INTRAMUSCULAR | Status: DC | PRN
  Administered 2013-04-29: 4 mg via INTRAVENOUS

## 2013-04-29 MED ORDER — VILAZODONE HCL 20 MG PO TABS
20.0000 mg | ORAL_TABLET | Freq: Every day | ORAL | Status: DC
  Administered 2013-04-30: 20 mg via ORAL
  Filled 2013-04-29 (×3): qty 1

## 2013-04-29 MED ORDER — ONDANSETRON HCL 4 MG/2ML IJ SOLN
4.0000 mg | INTRAMUSCULAR | Status: DC | PRN
  Filled 2013-04-29: qty 2

## 2013-04-29 MED ORDER — HYDROMORPHONE HCL PF 1 MG/ML IJ SOLN
INTRAMUSCULAR | Status: AC
  Filled 2013-04-29: qty 1

## 2013-04-29 MED ORDER — PROPOFOL 10 MG/ML IV BOLUS
INTRAVENOUS | Status: DC | PRN
  Administered 2013-04-29: 150 mg via INTRAVENOUS

## 2013-04-29 MED ORDER — PRAMIPEXOLE DIHYDROCHLORIDE 1 MG PO TABS
1.0000 mg | ORAL_TABLET | Freq: Every day | ORAL | Status: DC
  Administered 2013-04-29 – 2013-04-30 (×2): 1 mg via ORAL
  Filled 2013-04-29 (×3): qty 1

## 2013-04-29 MED ORDER — ACETAMINOPHEN 325 MG PO TABS: 650.0000 mg | ORAL_TABLET | ORAL | Status: DC | PRN

## 2013-04-29 MED ORDER — ONDANSETRON HCL 4 MG/2ML IJ SOLN: 4.0000 mg | Freq: Four times a day (QID) | INTRAMUSCULAR | Status: DC | PRN

## 2013-04-29 MED ORDER — SODIUM CHLORIDE 0.9 % IV SOLN: 250.0000 mL | INTRAVENOUS | Status: DC

## 2013-04-29 MED ORDER — DIAZEPAM 5 MG PO TABS
5.0000 mg | ORAL_TABLET | Freq: Four times a day (QID) | ORAL | Status: DC | PRN
  Administered 2013-04-29 – 2013-05-01 (×4): 5 mg via ORAL
  Filled 2013-04-29 (×5): qty 1

## 2013-04-29 MED ORDER — 0.9 % SODIUM CHLORIDE (POUR BTL) OPTIME
TOPICAL | Status: DC | PRN
  Administered 2013-04-29: 1000 mL

## 2013-04-29 MED ORDER — MUPIROCIN 2 % EX OINT
TOPICAL_OINTMENT | Freq: Two times a day (BID) | CUTANEOUS | Status: DC
  Administered 2013-04-29 – 2013-05-01 (×5): via NASAL
  Filled 2013-04-29: qty 22

## 2013-04-29 MED ORDER — MENTHOL 3 MG MT LOZG: 1.0000 | LOZENGE | OROMUCOSAL | Status: DC | PRN

## 2013-04-29 MED ORDER — BISACODYL 5 MG PO TBEC: 5.0000 mg | DELAYED_RELEASE_TABLET | Freq: Every day | ORAL | Status: DC | PRN

## 2013-04-29 MED ORDER — OXYCODONE HCL 5 MG/5ML PO SOLN: 5.0000 mg | Freq: Once | ORAL | Status: DC | PRN

## 2013-04-29 MED ORDER — MUPIROCIN 2 % EX OINT
TOPICAL_OINTMENT | Freq: Once | CUTANEOUS | Status: AC
  Administered 2013-04-29: 07:00:00 via NASAL
  Filled 2013-04-29: qty 22

## 2013-04-29 MED ORDER — THROMBIN 20000 UNITS EX SOLR
CUTANEOUS | Status: AC
  Filled 2013-04-29: qty 20000

## 2013-04-29 MED ORDER — THROMBIN 20000 UNITS EX SOLR
CUTANEOUS | Status: DC | PRN
  Administered 2013-04-29: 10:00:00 via TOPICAL

## 2013-04-29 SURGICAL SUPPLY — 83 items
APL SKNCLS STERI-STRIP NONHPOA (GAUZE/BANDAGES/DRESSINGS) ×1
BENZOIN TINCTURE PRP APPL 2/3 (GAUZE/BANDAGES/DRESSINGS) ×2 IMPLANT
BIT DRILL NEURO 2X3.1 SFT TUCH (MISCELLANEOUS) ×1 IMPLANT
BIT DRILL SKYLINE 12MM (BIT) IMPLANT
BLADE SURG 15 STRL LF DISP TIS (BLADE) ×1 IMPLANT
BLADE SURG 15 STRL SS (BLADE) ×2
BLADE SURG ROTATE 9660 (MISCELLANEOUS) ×1 IMPLANT
BUR MATCHSTICK NEURO 3.0 LAGG (BURR) IMPLANT
CAGE 16X6X14 ENDOSKEL MED (Orthopedic Implant) IMPLANT
CARTRIDGE OIL MAESTRO DRILL (MISCELLANEOUS) ×1 IMPLANT
CLOTH BEACON ORANGE TIMEOUT ST (SAFETY) ×2 IMPLANT
CLSR STERI-STRIP ANTIMIC 1/2X4 (GAUZE/BANDAGES/DRESSINGS) ×1 IMPLANT
COLLAR CERV LO CONTOUR FIRM DE (SOFTGOODS) IMPLANT
CORDS BIPOLAR (ELECTRODE) ×2 IMPLANT
COVER SURGICAL LIGHT HANDLE (MISCELLANEOUS) ×2 IMPLANT
CRADLE DONUT ADULT HEAD (MISCELLANEOUS) ×2 IMPLANT
DEVICE ENDSKLTN CRVCL 5MM-0SM (Orthopedic Implant) IMPLANT
DIFFUSER DRILL AIR PNEUMATIC (MISCELLANEOUS) ×2 IMPLANT
DRAIN JACKSON RD 7FR 3/32 (WOUND CARE) IMPLANT
DRAPE C-ARM 42X72 X-RAY (DRAPES) ×2 IMPLANT
DRAPE POUCH INSTRU U-SHP 10X18 (DRAPES) ×2 IMPLANT
DRAPE SURG 17X23 STRL (DRAPES) ×6 IMPLANT
DRILL BIT SKYLINE 12MM (BIT) ×2
DRILL NEURO 2X3.1 SOFT TOUCH (MISCELLANEOUS) ×2
DURAPREP 26ML APPLICATOR (WOUND CARE) ×2 IMPLANT
ELECT COATED BLADE 2.86 ST (ELECTRODE) ×2 IMPLANT
ELECT REM PT RETURN 9FT ADLT (ELECTROSURGICAL) ×2
ELECTRODE REM PT RTRN 9FT ADLT (ELECTROSURGICAL) ×1 IMPLANT
ENDOSKELETON CERVICAL 5MM-0SM (Orthopedic Implant) ×2 IMPLANT
ENDOSKELETON IMPLANT MED 6M-0 (Orthopedic Implant) ×2 IMPLANT
EVACUATOR SILICONE 100CC (DRAIN) IMPLANT
GAUZE SPONGE 4X4 16PLY XRAY LF (GAUZE/BANDAGES/DRESSINGS) ×2 IMPLANT
GLOVE BIO SURGEON STRL SZ7 (GLOVE) ×2 IMPLANT
GLOVE BIO SURGEON STRL SZ8 (GLOVE) ×2 IMPLANT
GLOVE BIOGEL PI IND STRL 7.5 (GLOVE) ×2 IMPLANT
GLOVE BIOGEL PI IND STRL 8 (GLOVE) ×1 IMPLANT
GLOVE BIOGEL PI INDICATOR 7.5 (GLOVE) ×2
GLOVE BIOGEL PI INDICATOR 8 (GLOVE) ×1
GLOVE BIOGEL PI ORTHO PRO SZ7 (GLOVE) ×1
GLOVE PI ORTHO PRO STRL SZ7 (GLOVE) IMPLANT
GLOVE SURG SS PI 7.0 STRL IVOR (GLOVE) ×1 IMPLANT
GOWN STRL NON-REIN LRG LVL3 (GOWN DISPOSABLE) ×2 IMPLANT
GOWN STRL REIN XL XLG (GOWN DISPOSABLE) ×3 IMPLANT
INTERLOCK LRDTC CRVCL VBR 6MM (Bone Implant) IMPLANT
IV CATH 14GX2 1/4 (CATHETERS) ×2 IMPLANT
KIT BASIN OR (CUSTOM PROCEDURE TRAY) ×2 IMPLANT
KIT ROOM TURNOVER OR (KITS) ×2 IMPLANT
LORDOTIC CERVICAL VBR 6MM SM (Bone Implant) ×2 IMPLANT
MANIFOLD NEPTUNE II (INSTRUMENTS) ×2 IMPLANT
NDL SPNL 20GX3.5 QUINCKE YW (NEEDLE) ×1 IMPLANT
NEEDLE 27GAX1X1/2 (NEEDLE) ×2 IMPLANT
NEEDLE SPNL 20GX3.5 QUINCKE YW (NEEDLE) ×2 IMPLANT
NS IRRIG 1000ML POUR BTL (IV SOLUTION) ×2 IMPLANT
OIL CARTRIDGE MAESTRO DRILL (MISCELLANEOUS) ×2
PACK ORTHO CERVICAL (CUSTOM PROCEDURE TRAY) ×2 IMPLANT
PAD ARMBOARD 7.5X6 YLW CONV (MISCELLANEOUS) ×4 IMPLANT
PATTIES SURGICAL .5 X.5 (GAUZE/BANDAGES/DRESSINGS) ×1 IMPLANT
PATTIES SURGICAL .5 X1 (DISPOSABLE) ×1 IMPLANT
PIN DISTRACTION 12MM (PIN) ×4
PIN DSTRCT 12XNS SS ACIS (PIN) IMPLANT
PIN TEMP SKYLINE THREADED (PIN) ×1 IMPLANT
PLATE SKYLINE 3LVL 45MM CERV (Plate) ×1 IMPLANT
PUTTY BONE DBX 5CC MIX (Putty) ×1 IMPLANT
SCREW SKYLINE VAR OS 14MM (Screw) ×2 IMPLANT
SCREW VAR SELF TAP SKYLINE 14M (Screw) ×6 IMPLANT
SPONGE GAUZE 4X4 12PLY (GAUZE/BANDAGES/DRESSINGS) ×2 IMPLANT
SPONGE INTESTINAL PEANUT (DISPOSABLE) ×2 IMPLANT
SPONGE SURGIFOAM ABS GEL 100 (HEMOSTASIS) ×2 IMPLANT
STRIP CLOSURE SKIN 1/2X4 (GAUZE/BANDAGES/DRESSINGS) ×2 IMPLANT
SURGIFLO TRUKIT (HEMOSTASIS) IMPLANT
SUT MNCRL AB 4-0 PS2 18 (SUTURE) ×2 IMPLANT
SUT SILK 4 0 (SUTURE)
SUT SILK 4-0 18XBRD TIE 12 (SUTURE) IMPLANT
SUT VIC AB 2-0 CT2 18 VCP726D (SUTURE) ×2 IMPLANT
SYR BULB IRRIGATION 50ML (SYRINGE) ×2 IMPLANT
SYR CONTROL 10ML LL (SYRINGE) ×4 IMPLANT
TAPE CLOTH 4X10 WHT NS (GAUZE/BANDAGES/DRESSINGS) ×1 IMPLANT
TAPE CLOTH SURG 4X10 WHT LF (GAUZE/BANDAGES/DRESSINGS) ×1 IMPLANT
TAPE UMBILICAL COTTON 1/8X30 (MISCELLANEOUS) ×2 IMPLANT
TOWEL OR 17X24 6PK STRL BLUE (TOWEL DISPOSABLE) ×2 IMPLANT
TOWEL OR 17X26 10 PK STRL BLUE (TOWEL DISPOSABLE) ×2 IMPLANT
WATER STERILE IRR 1000ML POUR (IV SOLUTION) ×2 IMPLANT
YANKAUER SUCT BULB TIP NO VENT (SUCTIONS) ×2 IMPLANT

## 2013-04-29 NOTE — H&P (Signed)
PREOPERATIVE H&P  Chief Complaint: R > L arm pain  HPI: Allison Guerrero is a 57 y.o. female who presents with ongoing pain in the left > right arm for almost 1 year. Patient failed conservative are including ESIs and PT and meds without relief. 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  Past Medical History  Diagnosis Date  . Cancer     cervical 20 yrs ago  . Blood transfusion     1983 r/t postpartum hemorrhage  . History of kidney stones     resolved with lithotripsy  . Mitral valve prolapse     very minor; no surgical intervention needed at this time  . Depression   . Hyperlipidemia   . Heart murmur   . Chronic kidney disease     kidney stones  . Headache(784.0)     migraines  . Arthritis    Past Surgical History  Procedure Laterality Date  . Tonsillectomy    . Lithotripsy  2013  . Breast surgery Bilateral 1986    implants  . Breast surgery Bilateral 1994    ruptured and replaced  . Breast surgery Bilateral 2013    lift and replaced with smaller breast  . Eye surgery    . Blepharoplasty Bilateral 45409   History   Social History  . Marital Status: Married    Spouse Name: N/A    Number of Children: N/A  . Years of Education: N/A   Social History Main Topics  . Smoking status: Former Smoker -- 1.00 packs/day for 20 years    Types: Cigarettes    Quit date: 04/29/2003  . Smokeless tobacco: None  . Alcohol Use: No  . Drug Use: No  . Sexual Activity: None   Other Topics Concern  . None   Social History Narrative  . None   Family History  Problem Relation Age of Onset  . Depression Mother   . Heart failure Mother   . Lung cancer Father   . Cervical cancer Sister    No Known Allergies Prior to Admission medications   Medication Sig Start Date End Date Taking? Authorizing Provider  Atorvastatin Calcium (LIPITOR PO) Take 1 tablet by mouth daily. Not sure of dose. Sample from md office   Yes Historical Provider, MD  Calcium Carbonate-Vitamin D (CALCIUM-VITAMIN D) 500-200  MG-UNIT per tablet Take 1 tablet by mouth 2 (two) times daily with a meal.     Yes Historical Provider, MD  estrogen, conjugated,-medroxyprogesterone (PREMPRO) 0.625-2.5 MG per tablet Take 1 tablet by mouth daily.     Yes Historical Provider, MD  HYDROcodone-acetaminophen (NORCO/VICODIN) 5-325 MG per tablet Take 1 tablet by mouth every 6 (six) hours as needed for pain (pain).   Yes Historical Provider, MD  LORazepam (ATIVAN) 2 MG tablet Take 2 mg by mouth at bedtime.     Yes Historical Provider, MD  pramipexole (MIRAPEX) 1 MG tablet Take 1 mg by mouth daily.   Yes Historical Provider, MD  topiramate (TOPAMAX) 50 MG tablet Take 50 mg by mouth daily.     Yes Historical Provider, MD  Vilazodone HCl (VIIBRYD) 20 MG TABS Take 20 mg by mouth daily.   Yes Historical Provider, MD  SUMAtriptan (IMITREX) 100 MG tablet Take 100 mg by mouth as needed.     Historical Provider, MD     All other systems have been reviewed and were otherwise negative with the exception of those mentioned in the HPI and as above.  Physical Exam: Filed Vitals:   04/29/13  0707  BP: 94/63  Pulse: 62  Temp: 97 F (36.1 C)  Resp: 16    General: Alert, no acute distress Cardiovascular: No pedal edema Respiratory: No cyanosis, no use of accessory musculature Skin: No lesions in the area of chief complaint Neurologic: Sensation intact distally Psychiatric: Patient is competent for consent with normal mood and affect Lymphatic: No axillary or cervical lymphadenopathy   Assessment/Plan: Left > right arm pain Plan for Procedure(s): ANTERIOR CERVICAL DECOMPRESSION/DISCECTOMY FUSION C4-7   Emilee Hero, MD 04/29/2013 8:09 AM

## 2013-04-29 NOTE — Anesthesia Procedure Notes (Signed)
Procedure Name: Intubation Date/Time: 04/29/2013 8:36 AM Performed by: Rogelia Boga Pre-anesthesia Checklist: Patient identified, Emergency Drugs available, Suction available, Patient being monitored and Timeout performed Patient Re-evaluated:Patient Re-evaluated prior to inductionOxygen Delivery Method: Circle system utilized Preoxygenation: Pre-oxygenation with 100% oxygen Intubation Type: IV induction Ventilation: Mask ventilation without difficulty Laryngoscope Size: Mac and 4 Grade View: Grade II Tube type: Oral Tube size: 7.0 mm Number of attempts: 1 Airway Equipment and Method: Stylet Placement Confirmation: ETT inserted through vocal cords under direct vision,  positive ETCO2 and breath sounds checked- equal and bilateral Secured at: 20 cm Tube secured with: Tape Dental Injury: Teeth and Oropharynx as per pre-operative assessment

## 2013-04-29 NOTE — Transfer of Care (Signed)
Immediate Anesthesia Transfer of Care Note  Patient: Temica Righetti  Procedure(s) Performed: Procedure(s) with comments: ANTERIOR CERVICAL DECOMPRESSION/DISCECTOMY FUSION 3 LEVELS (N/A) - Anterior cervical decompression fusion, cervical 4-5, cervical 5-6, cervical 6-7 wtih instrumentation and allograft  Patient Location: PACU  Anesthesia Type:General  Level of Consciousness: awake, alert , oriented and patient cooperative  Airway & Oxygen Therapy: Patient Spontanous Breathing and Patient connected to nasal cannula oxygen  Post-op Assessment: Report given to PACU RN, Post -op Vital signs reviewed and stable and Patient moving all extremities X 4  Post vital signs: Reviewed and stable  Complications: No apparent anesthesia complications

## 2013-04-29 NOTE — Progress Notes (Signed)
Orthopedic Tech Progress Note Patient Details:  Allison Guerrero Mar 30, 1956 [id number]  Ortho Devices Type of Ortho Device: [place] cervical collar Ortho Device/Splint Location: neck Ortho Device/Splint Interventions: Ordered Shower collar  Nikki Dom 04/29/2013, 2:57 PM

## 2013-04-29 NOTE — Anesthesia Postprocedure Evaluation (Signed)
Anesthesia Post Note  Patient: Allison Guerrero  Procedure(s) Performed: Procedure(s) (LRB): ANTERIOR CERVICAL DECOMPRESSION/DISCECTOMY FUSION 3 LEVELS (N/A)  Anesthesia type: General  Patient location: PACU  Post pain: Pain level controlled and Adequate analgesia  Post assessment: Post-op Vital signs reviewed, Patient's Cardiovascular Status Stable, Respiratory Function Stable, Patent Airway and Pain level controlled  Last Vitals:  Filed Vitals:   04/29/13 1245  BP: 126/67  Pulse:   Temp:   Resp:     Post vital signs: Reviewed and stable  Level of consciousness: awake, alert  and oriented  Complications: No apparent anesthesia complications

## 2013-04-29 NOTE — Preoperative (Signed)
Beta Blockers   Reason not to administer Beta Blockers:Not Applicable 

## 2013-04-29 NOTE — Anesthesia Preprocedure Evaluation (Addendum)
Anesthesia Evaluation  Patient identified by MRN, date of birth, ID band Patient awake    Reviewed: Allergy & Precautions, H&P , NPO status , Patient's Chart, lab work & pertinent test results  Airway Mallampati: II  Neck ROM: full    Dental  (+) Dental Advisory Given   Pulmonary former smoker,          Cardiovascular + Valvular Problems/Murmurs MVP     Neuro/Psych  Headaches, Depression    GI/Hepatic   Endo/Other    Renal/GU      Musculoskeletal  (+) Arthritis -, Osteoarthritis,    Abdominal   Peds  Hematology   Anesthesia Other Findings   Reproductive/Obstetrics                          Anesthesia Physical Anesthesia Plan  ASA: II  Anesthesia Plan: General   Post-op Pain Management:    Induction: Intravenous  Airway Management Planned: Oral ETT  Additional Equipment:   Intra-op Plan:   Post-operative Plan: Extubation in OR  Informed Consent: I have reviewed the patients History and Physical, chart, labs and discussed the procedure including the risks, benefits and alternatives for the proposed anesthesia with the patient or authorized representative who has indicated his/her understanding and acceptance.   Dental advisory given  Plan Discussed with: CRNA, Anesthesiologist and Surgeon  Anesthesia Plan Comments:        Anesthesia Quick Evaluation

## 2013-04-30 ENCOUNTER — Encounter (HOSPITAL_COMMUNITY): Payer: Self-pay

## 2013-04-30 ENCOUNTER — Encounter (HOSPITAL_COMMUNITY): Admission: RE | Payer: Self-pay | Source: Ambulatory Visit

## 2013-04-30 ENCOUNTER — Ambulatory Visit (HOSPITAL_COMMUNITY): Admission: RE | Admit: 2013-04-30 | Payer: 59 | Source: Ambulatory Visit | Admitting: Neurosurgery

## 2013-04-30 SURGERY — ANTERIOR CERVICAL DECOMPRESSION/DISCECTOMY FUSION 2 LEVELS
Anesthesia: General

## 2013-04-30 NOTE — Progress Notes (Signed)
UR COMPLETED  

## 2013-04-30 NOTE — Plan of Care (Signed)
Problem: Consults Goal: Diagnosis - Spinal Surgery Outcome: Completed/Met Date Met:  04/30/13 Cervical Spine Fusion     

## 2013-04-30 NOTE — Progress Notes (Signed)
Patient doing well. Minimal neck pain. Left arm pain resolved. Patient expresses concerns regarding going home, mainly related to stairs and her neck discomfort.   BP 89/54  Pulse 74  Temp(Src) 97.8 F (36.6 C) (Oral)  Resp 16  Ht 5\' 3"  (1.6 m)  Wt 49.896 kg (110 lb)  BMI 19.49 kg/m2  SpO2 100%  NVI -  Patient eating breakfast without difficulty Dressing and collar in place  POD #1 after C4-7 acdf  - patient seems to be ready for d/c home from a medical perspective, but expresses anxiety about stairs and her neck discomfort. I informed her that her neck discomfort with gradually lessen over time, but will not be significantly different today vs. tomorrow. I will order a PT evaluation and follow their recommendations regarding patient's safety at home.  - d/c home once clear by PT (today vs. tomorrow) - f/u 2 weeks

## 2013-04-30 NOTE — Op Note (Signed)
NAMEPORFIRIA, HEINRICH NO.:  [id number]  MEDICAL RECORD NO.:  [id number]  LOCATION:  [facility]                         FACILITY:  [facility]  PHYSICIAN:  Estill Bamberg, MD      DATE OF BIRTH:  [birth year]  DATE OF PROCEDURE:  04/29/2013                              OPERATIVE REPORT   PREOPERATIVE DIAGNOSES: 1. Bilateral cervical radiculopathy. 2. C4-C7 degenerative disk disease. 3. Bilateral neural foraminal stenosis to varying degrees involving C4-     5, C5-6, and C6-7.  POSTOPERATIVE DIAGNOSES: 1. Bilateral cervical radiculopathy. 2. C4-C7 degenerative disk disease. 3. Bilateral neural foraminal stenosis to varying degrees involving C4-     5, C5-6, and C6-7.  PROCEDURES: 1. Anterior cervical decompression and fusion, C4-5, C5-6, C6-7. 2. Placement of anterior instrumentation, C4-C5, C6-C7. 3. Insertion of interbody device x3 (Titan interbody spacers). 4. Use of morselized allograft. 5. Intraoperative use of fluoroscopy.  SURGEON:  Estill Bamberg, MD  ASSISTANT:  Jason Coop, PA-C.  ANESTHESIA:  General endotracheal anesthesia.  COMPLICATIONS:  None.  DISPOSITION:  Stable.  ESTIMATED BLOOD LOSS:  Minimal.  INDICATIONS FOR PROCEDURE:  Briefly, Ms. Knoop is a very pleasant 57- year-old female, who did initially present to me on March 02, 2013, with severe pain in her left and right arm.  Also of note, she does have what was thought to be intrinsic pathology involving the left shoulder.  I did review an MRI, which was clearly notable for moderate left-sided foraminal stenosis at C4-5, C5-6, as well as C6-7, as well as right- sided foraminal stenosis at C4-5 and C5-6.  The patient did undergo multiple forms of conservative care including physical therapy, and injections.  She obtained no relief with the conservative care rendered. We did discuss the risks and benefits regarding proceeding with a C4-C7 anterior cervical decompression and fusion with  instrumentation and allograft.  The patient did fully understand the risks and limitations of the procedure as outlined in my preoperative note.  OPERATIVE DETAILS:  On April 29, 2013, the patient was brought to surgery and general endotracheal anesthesia was administered.  The patient was placed supine on a hospital bed.  The neck was placed in a gentle degree of extension.  The patient's arms were secured to her sides.  The region of the ulnar nerves were padded, as were all bony prominences.  The neck was then prepped and draped in the usual sterile fashion and a time-out procedure was performed.  Antibiotics were given. I then made an incision from the midline to the medial border of the sternocleidomastoid muscle.  The platysma was identified and sharply incised.  The plane between the strap muscles medially and the sternocleidomastoid muscle laterally was identified and explored.  The anterior cervical spine was readily noted.  The carotid artery was retracted laterally.  I then obtained a lateral intraoperative fluoroscopic view to confirm the appropriate operative levels.  I then subperiosteally exposed the vertebral bodies of C4, C5, C6, and C7.  I then turned my attention towards the C6-7 interspace.  Caspar pins were placed into the C6 and C7 vertebral bodies and gentle distraction was applied.  Of note, self-retaining Shadow-Line retractor was also placed, centered  over the C6-7 interspace.  At this point, I did go forward with a diskectomy using a series of curettes and Kerrison punches and pituitary rongeur's.  The posterior longitudinal ligament was identified and taken down using a nerve hook.  I then used a #1 followed by #2 Kerrison to remove the posterior longitudinal ligament.  The right and the left neural foramina were thoroughly decompressed using a #2 Kerrison.  This was confirmed using a nerve hook.  I then prepared the endplates using curettes and I had  placed a series of trials.  I then selected an appropriate sized interbody spacer, which was packed with allograft and tamped into position under distraction.  Distraction was then discontinued from the Caspar pins and the Caspar pin from the C6 vertebral body was removed and bone wax was placed in its place.  This Caspar pin was then placed into the C5 vertebral body and distraction was applied.  A diskectomy was performed in the manner previously described.  Once again, a neural foraminal decompression was performed on both the right and the left sides.  The decompression was confirmed using a nerve hook bilaterally.  Again, the endplates were prepared once again and appropriate sized Titan interbody spacer was packed with allograft and tamped into position in the usual fashion.  Distraction was then discontinued and the Caspar pin was removed from the C6 vertebral body and placed into the C4 vertebral body.  Once again, distraction was applied, and a diskectomy was again performed.  Once again, the posterior longitudinal ligament was taken down and a thorough neural foraminal decompression was performed on the right and the left sides.  The decompression was confirmed using a nerve hook.  Once again, the endplates were prepared and an appropriate-sized interbody spacer was packed with allograft in the form of DBX mix, and tamped into position in the usual fashion.  I did obtain a lateral intraoperative fluoroscopic view, and was very pleased with the appearance of the construct.  I then chose an appropriate-sized anterior cervical plate, which was placed over the anterior cervical spine.  I did place two 14 mm variable angle screws in each vertebral body involving C4, C5, C6, and C7, for a total of 8 vertebral body screws.  I was happy with the purchase of each of the screws.  The can locking mechanism was then utilized, and these screws were locked to the plate.  The wound was  then copiously irrigated.  There was some minor areas of obvious bleeding which was uneventfully controlled using bipolar electrocautery.  I then obtained an AP and lateral fluoroscopic view and was very pleased with the appearance.  At this point, the platysma was closed using 2-0 Vicryl.  The skin was then closed using 3-0 Monocryl.  Benzoin and Steri- Strips were applied, followed by sterile dressing.  All instrument counts were correct at the termination of the procedure.  Of note, Jason Coop was my assistant throughout the entirety of the procedure, and did aid in essential retraction and suctioning needed throughout the entirety of the surgery.     Estill Bamberg, MD    MD/MEDQ  D:  04/29/2013  T:  04/29/2013  Job:  [id number]

## 2013-04-30 NOTE — Evaluation (Addendum)
Physical Therapy Evaluation Patient Details Name: Allison Guerrero MRN: [id number] DOB: Oct 29, 1955 Today's Date: 04/30/2013 Time: 4098-1191 PT Time Calculation (min): 18 min  PT Assessment / Plan / Recommendation History of Present Illness  s/p ACDF C4-C7  Clinical Impression  Patient is s/p ACDF C4-C7 POD#1 surgery resulting in functional limitations due to the deficits listed below (see PT Problem List). Patient will benefit from skilled PT to increase their independence and safety with mobility to allow discharge to the venue listed below. Pt very lethargic today and unsteady with gt. Pt unsteady to D/C home from mobility standpoint today. Will plan to address stairs in next session with husband present for family education.  May need cane or another AD to increase stability; pending on alertness.     PT Assessment  Patient needs continued PT services    Follow Up Recommendations  Home health PT;Supervision/Assistance - 24 hour    Does the patient have the potential to tolerate intense rehabilitation      Barriers to Discharge   pt has 2 story house; has to go to second floor which is 13 steps and no handrails    Equipment Recommendations  3in1 (PT)    Recommendations for Other Services     Frequency Min 5X/week    Precautions / Restrictions Precautions Precautions: Cervical Precaution Comments: pt given cervical precautions handout; educated on precautions; required min cues during session to maintain precautions Required Braces or Orthoses: Cervical Brace Cervical Brace: Hard collar;At all times Restrictions Weight Bearing Restrictions: No   Pertinent Vitals/Pain 6/10; BP 92/66 in standing       Mobility  Bed Mobility Bed Mobility: Supine to Sit;Sitting - Scoot to Edge of Bed Supine to Sit: 5: Supervision;HOB elevated;With rails Sitting - Scoot to Edge of Bed: 5: Supervision;With rail Details for Bed Mobility Assistance: pt performed bed mobility by coming long sit  to sit; encouraged pt to perform log rolling technique to decr pressure and strain on cervical spine; pt requried supervision for safety Transfers Transfers: Sit to Stand;Stand to Sit Sit to Stand: 4: Min guard;From bed Stand to Sit: 4: Min guard;To chair/3-in-1;With armrests Details for Transfer Assistance: min guard for safety; pt unsteady initially and very lethargic  Ambulation/Gait Ambulation/Gait Assistance: 4: Min assist Ambulation Distance (Feet): 60 Feet Assistive device: 1 person hand held assist Ambulation/Gait Assistance Details: pt unsteady with gt requiring handhel (A); pt falling asleep during amb; demo decr safety awarenes and required (A) around waist and with handheld support; pt amb with short shuffled gt  Gait Pattern: Decreased stride length;Shuffle;Narrow base of support Gait velocity: decr  Stairs: No (pt unsafe at this time) Naval architect Mobility: No         PT Diagnosis: Abnormality of gait;Acute pain  PT Problem List: Decreased balance;Decreased mobility;Decreased safety awareness;Pain PT Treatment Interventions: DME instruction;Gait training;Stair training;Functional mobility training;Therapeutic activities;Therapeutic exercise;Balance training;Neuromuscular re-education;Patient/family education     PT Goals(Current goals can be found in the care plan section) Acute Rehab PT Goals Patient Stated Goal: to go home tomorrow; i dont feel steady  PT Goal Formulation: With patient Time For Goal Achievement: 05/02/13 Potential to Achieve Goals: Good  Visit Information  Last PT Received On: 04/30/13 Assistance Needed: +1 History of Present Illness: s/p ACDF C4-C7       Prior Functioning  Home Living Family/patient expects to be discharged to:: Private residence Living Arrangements: Spouse/significant other Available Help at Discharge: Family;Available 24 hours/day Type of Home: Other(Comment) (Condo ) Home Access: Level entry  Home  Layout: Two level Alternate Level Stairs-Number of Steps: 13 Alternate Level Stairs-Rails: None Home Equipment: None Prior Function Level of Independence: Independent Communication Communication: No difficulties Dominant Hand: Left    Cognition  Cognition Arousal/Alertness: Lethargic;Suspect due to medications Behavior During Therapy: WFL for tasks assessed/performed Overall Cognitive Status: Within Functional Limits for tasks assessed Memory: Decreased recall of precautions;Decreased short-term memory    Extremity/Trunk Assessment Upper Extremity Assessment Upper Extremity Assessment: Overall WFL for tasks assessed Lower Extremity Assessment Lower Extremity Assessment: Overall WFL for tasks assessed Cervical / Trunk Assessment Cervical / Trunk Assessment: Normal   Balance Balance Balance Assessed: Yes Static Standing Balance Static Standing - Balance Support: Right upper extremity supported;During functional activity Static Standing - Level of Assistance: 4: Min assist Static Standing - Comment/# of Minutes: handheld (A) required to take BP while standing; pt unsteady and reports she feels off balance   End of Session PT - End of Session Equipment Utilized During Treatment: Cervical collar;Gait belt Activity Tolerance: Patient tolerated treatment well Patient left: in chair;with call bell/phone within reach;with family/visitor present Nurse Communication: Mobility status  GP     Donell Sievert,  [phone] 04/30/2013, 1:59 PM

## 2013-05-01 ENCOUNTER — Encounter (HOSPITAL_COMMUNITY): Payer: Self-pay | Admitting: Orthopedic Surgery

## 2013-05-01 MED ORDER — HYDROCODONE-ACETAMINOPHEN 7.5-325 MG PO TABS
1.0000 | ORAL_TABLET | Freq: Four times a day (QID) | ORAL | Status: DC | PRN
Start: 2013-05-01 — End: 2017-05-07

## 2013-05-01 MED ORDER — HYDROCODONE-ACETAMINOPHEN 7.5-325 MG PO TABS: 1.0000 | ORAL_TABLET | Freq: Four times a day (QID) | ORAL | Status: DC | PRN

## 2013-05-01 MED ORDER — ONDANSETRON HCL 4 MG PO TABS
4.0000 mg | ORAL_TABLET | Freq: Once | ORAL | Status: AC
  Administered 2013-05-01: 4 mg via ORAL
  Filled 2013-05-01: qty 1

## 2013-05-01 NOTE — Progress Notes (Signed)
05/01/13 Spoke with patient and her husband on 04/30/13 about HHC. Patient selected [facility] from [place] [facility] agencies list. [place], spoke with Burna Mortimer, set up HHPT start date 05/04/13. Faxed H and P, op note, PT eval and facesheet to [phone], received confirmation. Today contacted Darian with [facility] and requested 3N1 be delivered to patient prior to d/c. Jacquelynn Cree RN, BSN, CCM

## 2013-05-01 NOTE — Progress Notes (Signed)
05/01/13 Received call from [place] at [facility] that they are not in network for patient's insurance. Informed patient that [facility] would not be able to work with them. Patient stated to contact any agency in network.  Contacted [employer] at [phone] and was told [facility] is in network. Contacted Elijah Michaelis with [facility] and set up HHPT. Called patient and informed her and her husband that [facility] would be providing HHPT and gave them [facility]'s phone number if needed. Jacquelynn Cree RN, BSN, CCM

## 2013-05-01 NOTE — Progress Notes (Deleted)
Georga Bora, PA-C Physician Assistant Signed Orthopedics Progress Notes Service date: 05/01/2013 7:31 AM   Patient doing well. Minimal neck pain. Left arm pain resolved. L shoulder pain ongoing as expected. Pt to undergo PT for stair ambulation today. Reports N/V with pain medication this am, also moderately sedated on medications.  BP 111/61  Pulse 70  Temp(Src) 98.1 F (36.7 C) (Oral)  Resp 18  Ht 5\' 3"  (1.6 m)  Wt 49.896 kg (110 lb)  BMI 19.49 kg/m2  SpO2 100%  NVI, Patient eating breakfast without difficulty, Dressing CDI and collar in place  POD #2 after C4-7 acdf  - patient ready for d/c home from a medical perspective, will complete PT for stair ambulation today and  - d/c today after PT  - Cont Valium, percocet d/c'd and Norco 7.5 provided  - f/u 2 weeks

## 2013-05-01 NOTE — Progress Notes (Signed)
Patient doing well. Minimal neck pain. Left arm pain resolved. L shoulder pain ongoing as expected. Pt to undergo PT for stair ambulation today. Reports N/V with pain medication this am, also moderately sedated on medications.  BP 111/61  Pulse 70  Temp(Src) 98.1 F (36.7 C) (Oral)  Resp 18  Ht 5\' 3"  (1.6 m)  Wt 49.896 kg (110 lb)  BMI 19.49 kg/m2  SpO2 100%  NVI, Patient eating breakfast without difficulty, Dressing CDI and collar in place  POD #2 after C4-7 acdf   - patient ready for d/c home from a medical perspective, will complete PT for stair ambulation today and  - d/c today after PT  - Cont Valium, percocet d/c'd and Norco 7.5 provided  - f/u 2 weeks

## 2013-05-01 NOTE — Progress Notes (Signed)
Physical Therapy Treatment Patient Details Name: Allison Guerrero MRN: [id number] DOB: [birth year] Today's Date: 05/01/2013 Time: 4696-2952 PT Time Calculation (min): 24 min  PT Assessment / Plan / Recommendation  History of Present Illness s/p ACDF C4-C7   PT Comments   Pt continues to be unsteady with gt; does benefit from [facility] to increase stability. Encouraged pt to have 24/7 (A) with mobility upon D/C; pt agreeable. Pt was able to amb a flight of steps with SPC supporting Lt UE and handheld (A) in Rt UE. Pt is safe to D/C home with adequate (A) and AD for mobility. Pt to benefit from [facility] and 3 in 1 upon D/C and HHPT to ensure safe transition home.   Follow Up Recommendations  Home health PT;Supervision/Assistance - 24 hour     Does the patient have the potential to tolerate intense rehabilitation     Barriers to Discharge        Equipment Recommendations  3in1 (PT);Cane    Recommendations for Other Services    Frequency Min 5X/week   Progress towards PT Goals Progress towards PT goals: Progressing toward goals  Plan Current plan remains appropriate    Precautions / Restrictions Precautions Precautions: Cervical;Fall Precaution Comments: reviewed cervical precautions handout with pt; pt able to recall 2/3 independently; min cues during session to maintain   Required Braces or Orthoses: Cervical Brace Cervical Brace: Hard collar;At all times Restrictions Weight Bearing Restrictions: No   Pertinent Vitals/Pain 6/10 RN notified     Mobility  Bed Mobility Bed Mobility: Supine to Sit;Sitting - Scoot to Edge of Bed Supine to Sit: 5: Supervision;HOB elevated Sitting - Scoot to [place] of Bed: 5: Supervision Details for Bed Mobility Assistance: pt performed long sitting to EOB technique; reviewed log rolling technique with pt and pt verbalized understaning; however, she states that she prefers sleeping in reclined position and hopes husband will buy her one for at home; supervision  for safety and cues  Transfers Transfers: Sit to Stand;Stand to Sit Sit to Stand: 4: Min guard;From bed Stand to Sit: 5: Supervision;To bed;To chair/3-in-1;With armrests Details for Transfer Assistance: min guard for initial sit to stand; pt unsteady and weightshifting onto heels with posterior lean; cues for hand placement and safety with cane with transfers  Ambulation/Gait Ambulation/Gait Assistance: 4: Min guard Ambulation Distance (Feet): 200 Feet Assistive device: Straight cane Ambulation/Gait Assistance Details: pt continues to be unsteady with gt and amb with narrow BOS and scissor gt frequently; encouraged pt to incr BOS and upright posture for gt; cues for safety with SPC and min guard to steady primarily with dynamic gt changes (ie directional changes and speed changes)  Gait Pattern: Decreased stride length;Shuffle;Narrow base of support Gait velocity: decr  General Gait Details: pt encouraged to have 24/7 (A) when transferring/amb;pt verbalized understanding  Stairs: Yes Stairs Assistance: 4: Min guard Stair Management Technique: No rails;Step to pattern;With cane;Other (comment) (cane in Lt UE and handheld (A) in Rt UE ) Number of Stairs: 15 Wheelchair Mobility Wheelchair Mobility: No         PT Diagnosis:    PT Problem List:   PT Treatment Interventions:     PT Goals (current goals can now be found in the care plan section) Acute Rehab PT Goals Patient Stated Goal: to go home today and get a recliner to sleep in  PT Goal Formulation: With patient Time For Goal Achievement: 05/02/13 Potential to Achieve Goals: Good  Visit Information  Last PT Received On: 05/01/13 Assistance Needed: +  1 History of Present Illness: s/p ACDF C4-C7    Subjective Data  Subjective: Pt lying supine "i keep falling asleep, spilling stuff on me."  Patient Stated Goal: to go home today and get a recliner to sleep in    Cognition  Cognition Arousal/Alertness: Awake/alert Behavior  During Therapy: WFL for tasks assessed/performed Overall Cognitive Status: Within Functional Limits for tasks assessed    Balance  Balance Balance Assessed: Yes Static Standing Balance Static Standing - Balance Support: Left upper extremity supported;During functional activity Static Standing - Level of Assistance: 5: Stand by assistance  End of Session PT - End of Session Equipment Utilized During Treatment: Cervical collar;Gait belt Activity Tolerance: Patient tolerated treatment well Patient left: in chair;with call bell/phone within reach Nurse Communication: Mobility status;Other (comment) (DME needs for D/C )   GP     Donell Sievert, [place] [phone] 05/01/2013, 8:28 AM

## 2013-05-06 NOTE — Discharge Summary (Signed)
Patient ID: Allison Guerrero MRN: [id number] DOB/AGE: 09/19/1955 57 y.o.  Admit date: 04/29/2013 Discharge date: 05/01/2013  Admission Diagnoses: Radiculopathy   Discharge Diagnoses:  Same  Past Medical History  Diagnosis Date  . Cancer     cervical 20 yrs ago  . Blood transfusion     1983 r/t postpartum hemorrhage  . History of kidney stones     resolved with lithotripsy  . Mitral valve prolapse     very minor; no surgical intervention needed at this time  . Depression   . Hyperlipidemia   . Heart murmur   . Chronic kidney disease     kidney stones  . Headache(784.0)     migraines  . Arthritis     Surgeries: Procedure(s): ANTERIOR CERVICAL DECOMPRESSION/DISCECTOMY FUSION 3 LEVELS C4-7 on 04/29/2013    Discharged Condition: Improved  Hospital Course: Garry Sartwell is an 57 y.o. female who was admitted 04/29/2013 for operative treatment of radiculopathy. Patient has severe unremitting pain that affects sleep, daily activities, and work/hobbies. After pre-op clearance the patient was taken to the operating room on 04/29/2013 and underwent  Procedure(s): ANTERIOR CERVICAL DECOMPRESSION/DISCECTOMY FUSION 3 LEVELS C4-7.    Patient was given perioperative antibiotics:  Anti-infectives   Start     Dose/Rate Route Frequency Ordered Stop   04/29/13 1500  ceFAZolin (ANCEF) IVPB 1 g/50 mL premix     1 g 100 mL/hr over 30 Minutes Intravenous Every 8 hours 04/29/13 1442 04/29/13 2241   04/29/13 0600  ceFAZolin (ANCEF) IVPB 2 g/50 mL premix     2 g 100 mL/hr over 30 Minutes Intravenous On call to O.R. 04/28/13 1441 04/29/13 0845       Patient was given sequential compression devices, early ambulation to prevent DVT.  Patient benefited maximally from hospital stay and there were no complications.    Recent vital signs: BP 111/61  Pulse 70  Temp(Src) 98.1 F (36.7 C) (Oral)  Resp 18  Ht 5' 3" (1.6 m)  Wt 49.896 kg (110 lb)  BMI 19.49 kg/m2  SpO2 100%    Discharge  Medications:     Medication List    STOP taking these medications       HYDROcodone-acetaminophen 5-325 MG per tablet  Commonly known as:  NORCO/VICODIN  Replaced by:  HYDROcodone-acetaminophen 7.5-325 MG per tablet      TAKE these medications       calcium-vitamin D 500-200 MG-UNIT per tablet  Take 1 tablet by mouth 2 (two) times daily with a meal.     estrogen (conjugated)-medroxyprogesterone 0.625-2.5 MG per tablet  Commonly known as:  PREMPRO  Take 1 tablet by mouth daily.     HYDROcodone-acetaminophen 7.5-325 MG per tablet  Commonly known as:  NORCO  Take 1-2 tablets by mouth every 6 (six) hours as needed for pain.     LORazepam 2 MG tablet  Commonly known as:  ATIVAN  Take 2 mg by mouth at bedtime.     pramipexole 1 MG tablet  Commonly known as:  MIRAPEX  Take 1 mg by mouth daily.     pravastatin 80 MG tablet  Commonly known as:  PRAVACHOL  Take 80 mg by mouth daily.     SUMAtriptan 100 MG tablet  Commonly known as:  IMITREX  Take 100 mg by mouth as needed.     TOPAMAX 50 MG tablet  Generic drug:  topiramate  Take 50 mg by mouth daily.     VIIBRYD 20 MG Tabs  Generic drug:    Vilazodone HCl  Take 20 mg by mouth daily.        Diagnostic Studies: Dg Chest 2 View  04/28/2013   *RADIOLOGY REPORT*  Clinical Data:  Preoperative evaluation for cervical spine fusion, history smoking, hyperlipidemia  CHEST - 2 VIEW  Comparison: 01/10/2012  Findings: Normal heart size, mediastinal contours, and pulmonary vascularity. Lungs hyperinflated but clear. No pleural effusion or pneumothorax. Bones unremarkable.  IMPRESSION: Hyperinflated lungs without acute abnormalities.   Original Report Authenticated By: Mark Boles, M.D.   Dg Cervical Spine 1 View  04/29/2013   *RADIOLOGY REPORT*  Clinical Data: Surgical fusion  DG CERVICAL SPINE - 1 VIEW  Comparison: 10/07/2012  Findings: Anterior plate and screws transfix C4, C5, C6, and C7 with interposed disc spacers.  Anatomic  alignment.  No vertebral compression deformity.  No breakage of the hardware.  IMPRESSION: Anterior discectomy and fusion at C4-C7.   Original Report Authenticated By: Arthur Hoss, M.D.    Disposition: 06-Home-Health Care Svc   POD #2 after C4-7 acdf  - patient ready for d/c home from a medical perspective, will complete PT for stair ambulation today and  - d/c today after PT  - Cont Valium, percocet d/c'd and Norco 7.5 provided  - f/u 2 weeks in office  Signed: Mattisyn Cardona J 05/06/2013, 11:56 AM  

## 2013-05-06 NOTE — Progress Notes (Signed)
Patient ID: Allison Guerrero MRN: [id number] DOB/AGE: 1956/05/18 57 y.o.  Admit date: 04/29/2013 Discharge date: 05/01/2013  Admission Diagnoses: Radiculopathy   Discharge Diagnoses:  Same  Past Medical History  Diagnosis Date  . Cancer     cervical 20 yrs ago  . Blood transfusion     1983 r/t postpartum hemorrhage  . History of kidney stones     resolved with lithotripsy  . Mitral valve prolapse     very minor; no surgical intervention needed at this time  . Depression   . Hyperlipidemia   . Heart murmur   . Chronic kidney disease     kidney stones  . Headache(784.0)     migraines  . Arthritis     Surgeries: Procedure(s): ANTERIOR CERVICAL DECOMPRESSION/DISCECTOMY FUSION 3 LEVELS C4-7 on 04/29/2013    Discharged Condition: Improved  Hospital Course: Allison Guerrero is an 57 y.o. female who was admitted 04/29/2013 for operative treatment of radiculopathy. Patient has severe unremitting pain that affects sleep, daily activities, and work/hobbies. After pre-op clearance the patient was taken to the operating room on 04/29/2013 and underwent  Procedure(s): ANTERIOR CERVICAL DECOMPRESSION/DISCECTOMY FUSION 3 LEVELS C4-7.    Patient was given perioperative antibiotics:  Anti-infectives   Start     Dose/Rate Route Frequency Ordered Stop   04/29/13 1500  ceFAZolin (ANCEF) IVPB 1 g/50 mL premix     1 g 100 mL/hr over 30 Minutes Intravenous Every 8 hours 04/29/13 1442 04/29/13 2241   04/29/13 0600  ceFAZolin (ANCEF) IVPB 2 g/50 mL premix     2 g 100 mL/hr over 30 Minutes Intravenous On call to O.R. 04/28/13 1441 04/29/13 0845       Patient was given sequential compression devices, early ambulation to prevent DVT.  Patient benefited maximally from hospital stay and there were no complications.    Recent vital signs: BP 111/61  Pulse 70  Temp(Src) 98.1 F (36.7 C) (Oral)  Resp 18  Ht 5\' 3"  (1.6 m)  Wt 49.896 kg (110 lb)  BMI 19.49 kg/m2  SpO2 100%    Discharge  Medications:     Medication List    STOP taking these medications       HYDROcodone-acetaminophen 5-325 MG per tablet  Commonly known as:  NORCO/VICODIN  Replaced by:  HYDROcodone-acetaminophen 7.5-325 MG per tablet      TAKE these medications       calcium-vitamin D 500-200 MG-UNIT per tablet  Take 1 tablet by mouth 2 (two) times daily with a meal.     estrogen (conjugated)-medroxyprogesterone 0.625-2.5 MG per tablet  Commonly known as:  PREMPRO  Take 1 tablet by mouth daily.     HYDROcodone-acetaminophen 7.5-325 MG per tablet  Commonly known as:  NORCO  Take 1-2 tablets by mouth every 6 (six) hours as needed for pain.     LORazepam 2 MG tablet  Commonly known as:  ATIVAN  Take 2 mg by mouth at bedtime.     pramipexole 1 MG tablet  Commonly known as:  MIRAPEX  Take 1 mg by mouth daily.     pravastatin 80 MG tablet  Commonly known as:  PRAVACHOL  Take 80 mg by mouth daily.     SUMAtriptan 100 MG tablet  Commonly known as:  IMITREX  Take 100 mg by mouth as needed.     TOPAMAX 50 MG tablet  Generic drug:  topiramate  Take 50 mg by mouth daily.     VIIBRYD 20 MG Tabs  Generic drug:  Vilazodone HCl  Take 20 mg by mouth daily.        Diagnostic Studies: Dg Chest 2 View  04/28/2013   *RADIOLOGY REPORT*  Clinical Data:  Preoperative evaluation for cervical spine fusion, history smoking, hyperlipidemia  CHEST - 2 VIEW  Comparison: 01/10/2012  Findings: Normal heart size, mediastinal contours, and pulmonary vascularity. Lungs hyperinflated but clear. No pleural effusion or pneumothorax. Bones unremarkable.  IMPRESSION: Hyperinflated lungs without acute abnormalities.   Original Report Authenticated By: Ulyses Southward, M.D.   Dg Cervical Spine 1 View  04/29/2013   *RADIOLOGY REPORT*  Clinical Data: Surgical fusion  DG CERVICAL SPINE - 1 VIEW  Comparison: 10/07/2012  Findings: Anterior plate and screws transfix C4, C5, C6, and C7 with interposed disc spacers.  Anatomic  alignment.  No vertebral compression deformity.  No breakage of the hardware.  IMPRESSION: Anterior discectomy and fusion at C4-C7.   Original Report Authenticated By: Jolaine Click, M.D.    Disposition: 06-Home-Health Care Svc   POD #2 after C4-7 acdf  - patient ready for d/c home from a medical perspective, will complete PT for stair ambulation today and  - d/c today after PT  - Cont Valium, percocet d/c'd and Norco 7.5 provided  - f/u 2 weeks in office  Signed: Georga Bora 05/06/2013, 11:56 AM

## 2014-01-03 IMAGING — CR DG CHEST 2V
2 series · 2 of 2 positions shown · non-contrast
Comparison: 01/10/2012

CLINICAL DATA: Preoperative evaluation for cervical spine fusion,
history smoking, hyperlipidemia

CHEST - 2 VIEW

[w chest pa]
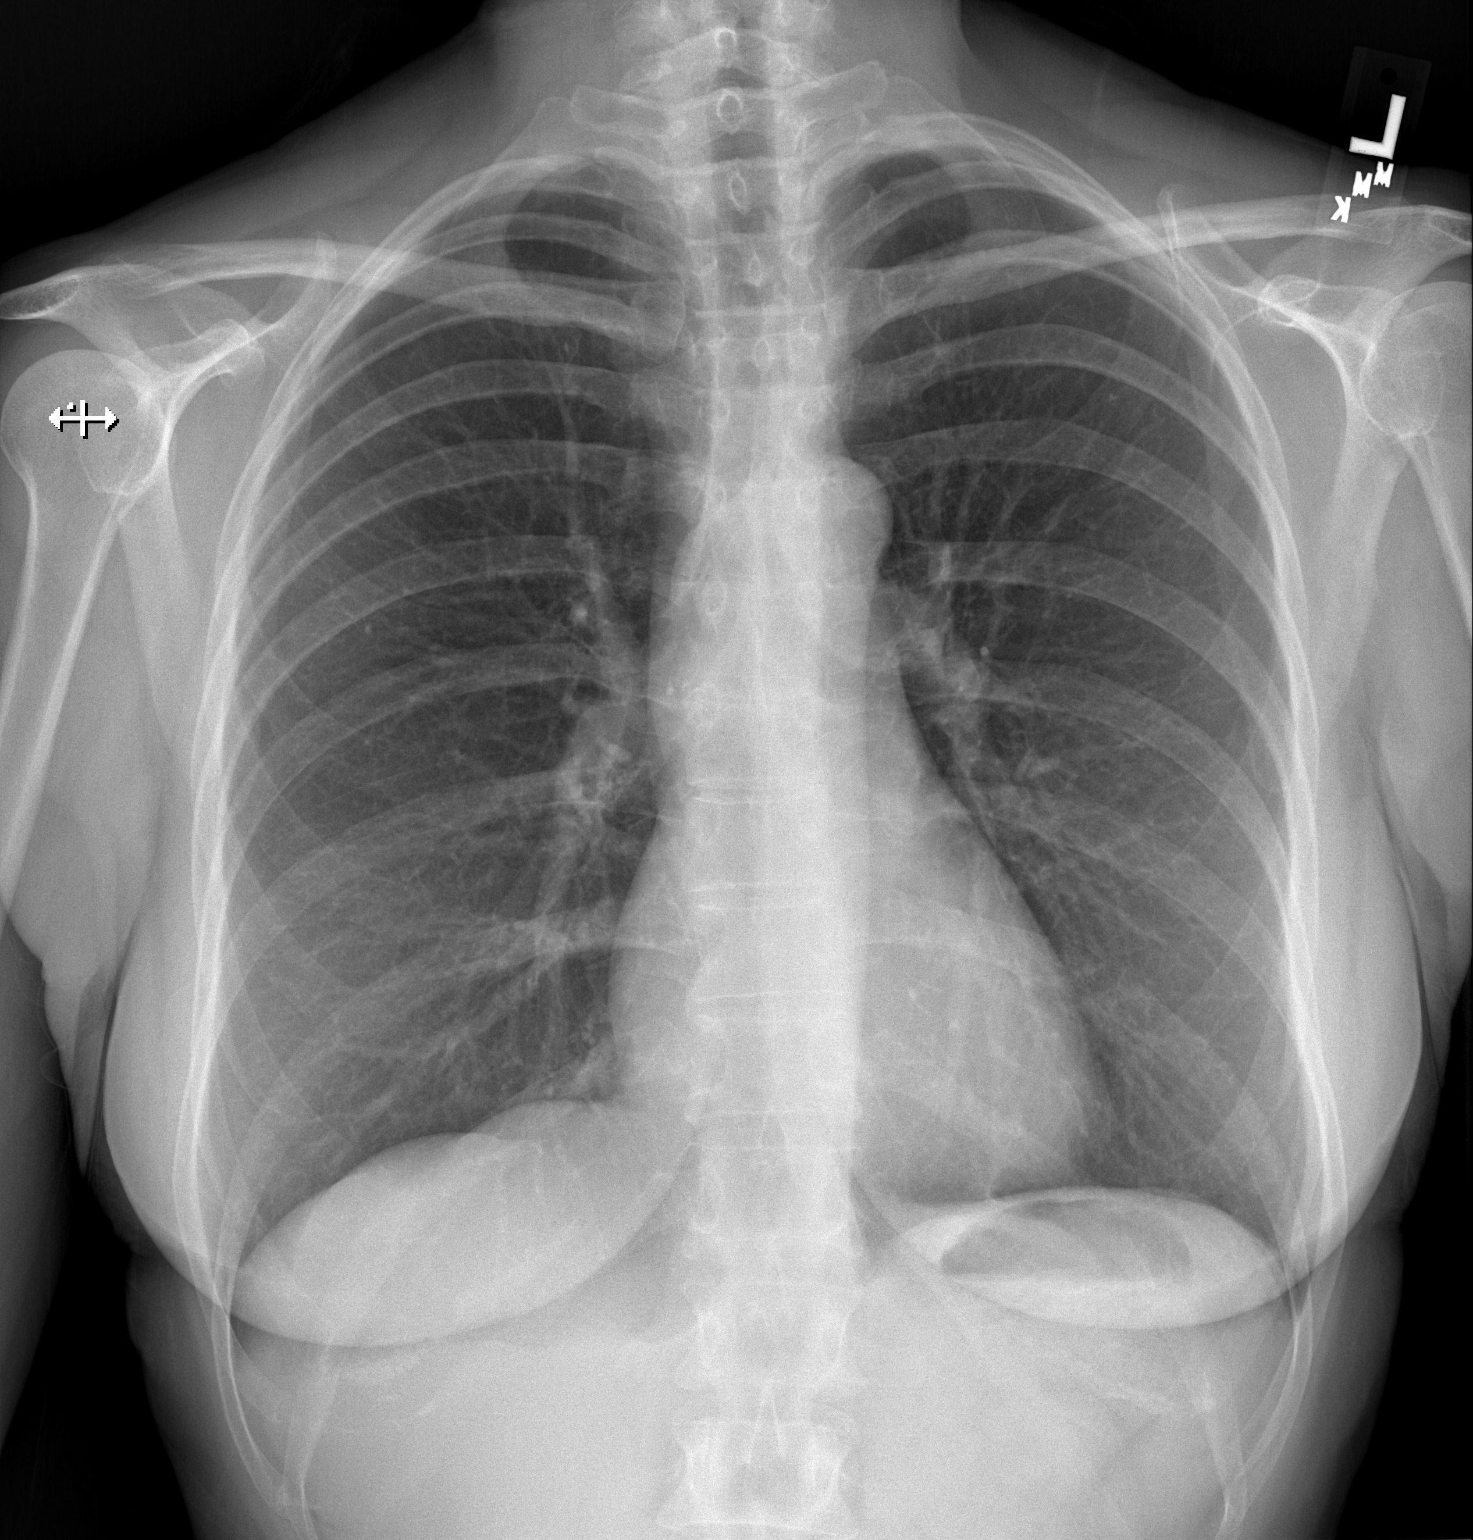

[w chest lat]
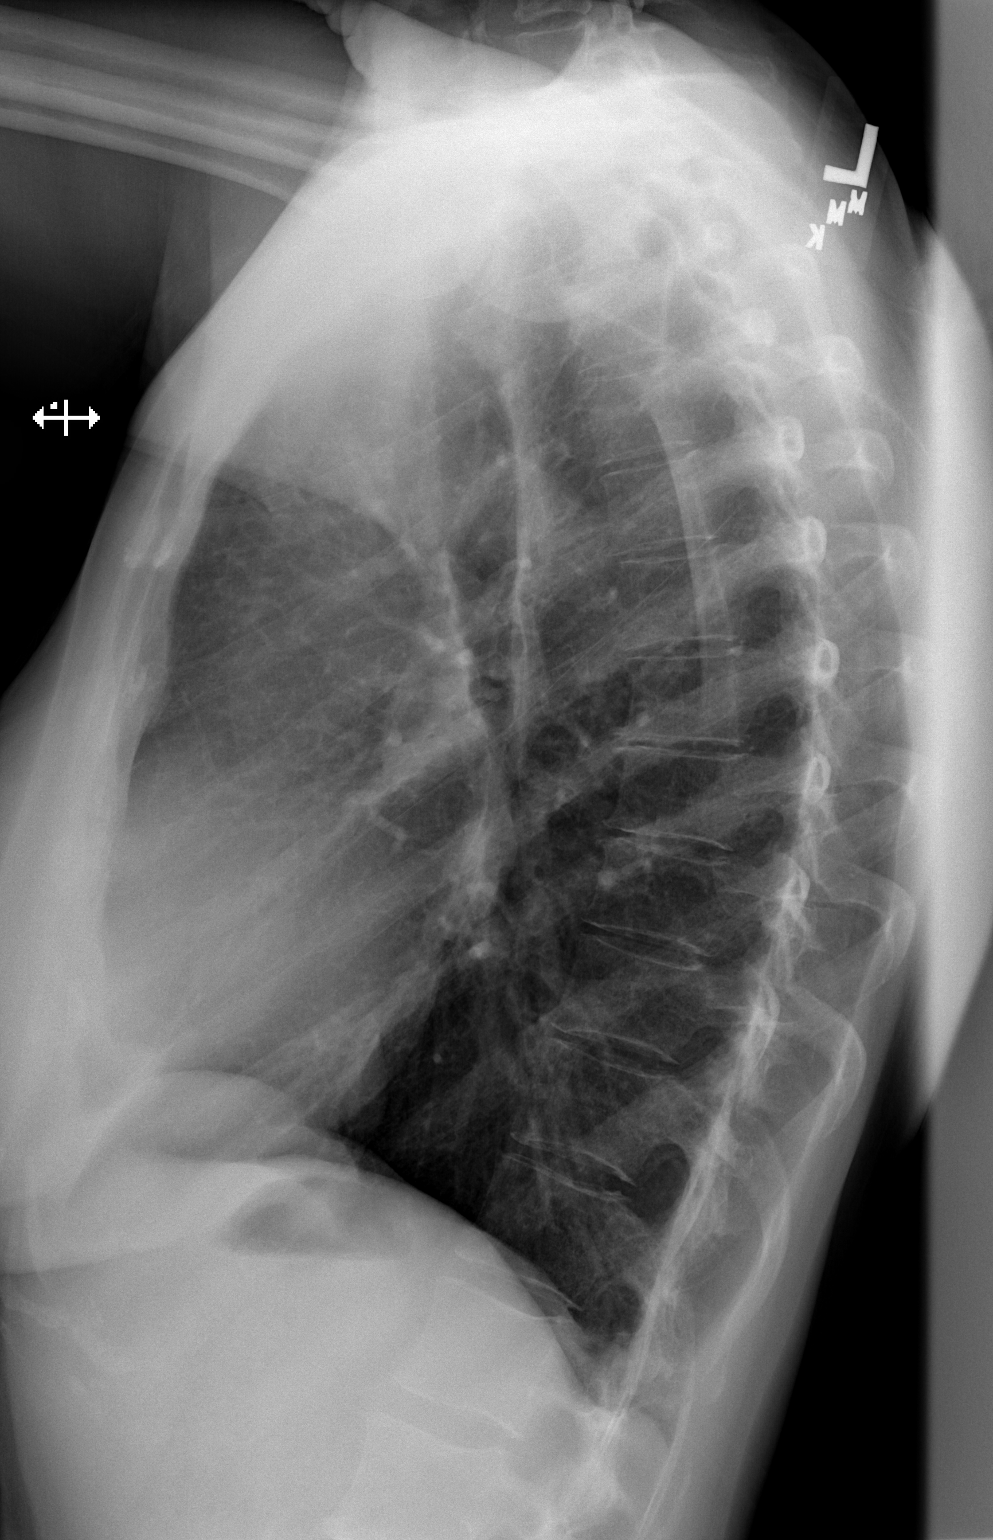

[2 of 2 positions shown; findings below may reference images not displayed]

FINDINGS: Normal heart size, mediastinal contours, and pulmonary vascularity.
Lungs hyperinflated but clear.
No pleural effusion or pneumothorax.
Bones unremarkable.
IMPRESSION: Hyperinflated lungs without acute abnormalities.

## 2014-03-15 ENCOUNTER — Other Ambulatory Visit (HOSPITAL_COMMUNITY)
Admission: RE | Admit: 2014-03-15 | Discharge: 2014-03-15 | Disposition: A | Discharge status: Home / Self Care | Payer: 59 | Source: Ambulatory Visit | Attending: Internal Medicine | Admitting: Internal Medicine

## 2014-03-15 ENCOUNTER — Other Ambulatory Visit: Payer: Self-pay | Admitting: Registered Nurse

## 2014-03-15 DIAGNOSIS — Z01419 Encounter for gynecological examination (general) (routine) without abnormal findings: Secondary | ICD-10-CM | POA: Insufficient documentation

## 2014-03-17 LAB — CYTOLOGY - PAP

## 2014-11-01 IMAGING — RF DG C-ARM 61-120 MIN
1 series · 1 of 1 positions shown · non-contrast
Comparison: 10/07/2012

CLINICAL DATA: Surgical fusion

DG C-ARM 1-60 MIN,DG CERVICAL SPINE - 1 VIEW

[Series 1: run · 1 of 1 slices shown]
[im 1/1]
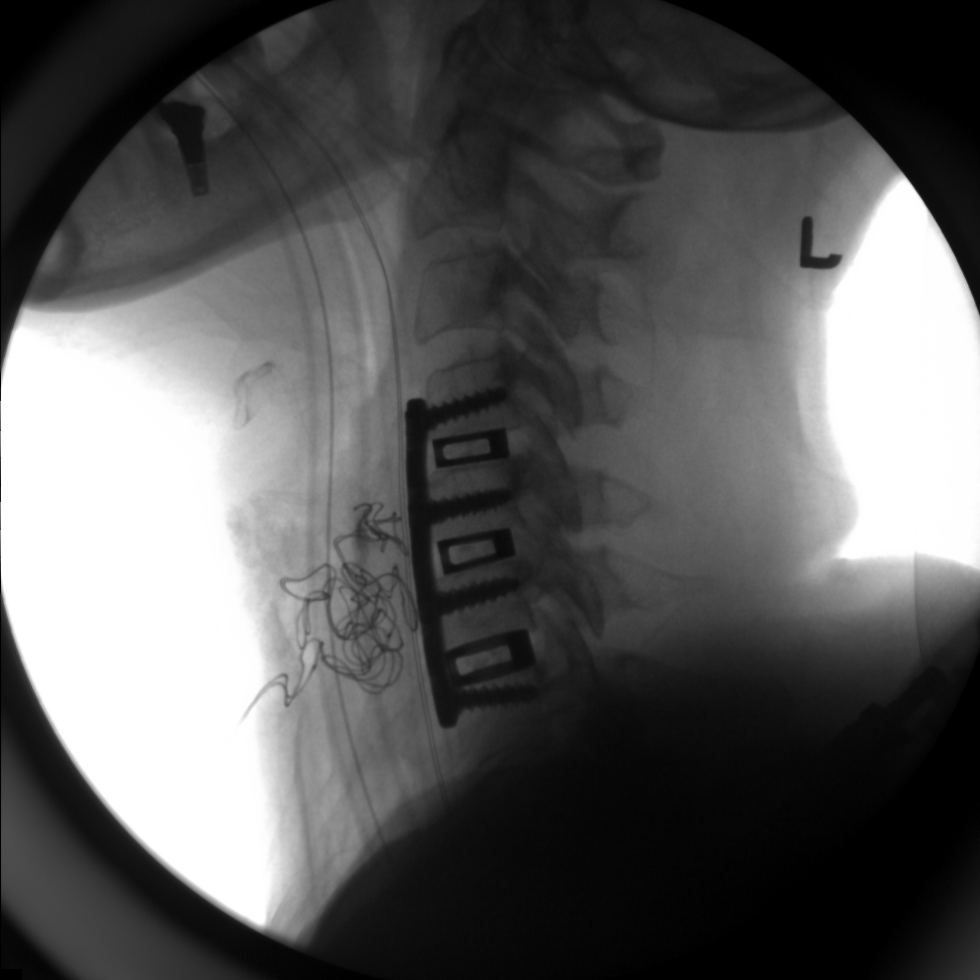

[1 of 1 positions shown; findings below may reference images not displayed]

FINDINGS: Anterior plate and screws transfix C4, C5, C6, and C7
with interposed disc spacers.  Anatomic alignment.  No vertebral
compression deformity.  No breakage of the hardware.
IMPRESSION: Anterior discectomy and fusion at C4-C7.

## 2016-02-28 ENCOUNTER — Other Ambulatory Visit: Payer: Self-pay | Admitting: Registered Nurse

## 2016-02-28 ENCOUNTER — Other Ambulatory Visit (HOSPITAL_COMMUNITY)
Admission: RE | Admit: 2016-02-28 | Discharge: 2016-02-28 | Disposition: A | Discharge status: Home / Self Care | Payer: 59 | Source: Ambulatory Visit | Attending: Internal Medicine | Admitting: Internal Medicine

## 2016-02-28 DIAGNOSIS — Z01419 Encounter for gynecological examination (general) (routine) without abnormal findings: Secondary | ICD-10-CM | POA: Insufficient documentation

## 2016-03-02 LAB — CYTOLOGY - PAP

## 2017-05-07 ENCOUNTER — Ambulatory Visit (INDEPENDENT_AMBULATORY_CARE_PROVIDER_SITE_OTHER): Payer: 59 | Admitting: Cardiovascular Disease

## 2017-05-07 ENCOUNTER — Encounter: Payer: Self-pay | Admitting: Cardiovascular Disease

## 2017-05-07 VITALS — BP 104/66 | HR 89 | Ht 63.0 in | Wt 140.0 lb

## 2017-05-07 DIAGNOSIS — M5030 Other cervical disc degeneration, unspecified cervical region: Secondary | ICD-10-CM | POA: Diagnosis not present

## 2017-05-07 DIAGNOSIS — I341 Nonrheumatic mitral (valve) prolapse: Secondary | ICD-10-CM

## 2017-05-07 DIAGNOSIS — G4719 Other hypersomnia: Secondary | ICD-10-CM

## 2017-05-07 DIAGNOSIS — Z8669 Personal history of other diseases of the nervous system and sense organs: Secondary | ICD-10-CM | POA: Diagnosis not present

## 2017-05-07 DIAGNOSIS — G2581 Restless legs syndrome: Secondary | ICD-10-CM | POA: Diagnosis not present

## 2017-05-07 DIAGNOSIS — G4733 Obstructive sleep apnea (adult) (pediatric): Secondary | ICD-10-CM | POA: Diagnosis not present

## 2017-05-07 DIAGNOSIS — E785 Hyperlipidemia, unspecified: Secondary | ICD-10-CM

## 2017-05-07 DIAGNOSIS — R609 Edema, unspecified: Secondary | ICD-10-CM

## 2017-05-07 NOTE — Patient Instructions (Signed)
Medication Instructions:  Your physician recommends that you continue on your current medications as directed. Please refer to the Current Medication list given to you today.  Testing/Procedures: Your physician has requested that you have an echocardiogram ASAP. Echocardiography is a painless test that uses sound waves to create images of your heart. It provides your doctor with information about the size and shape of your heart and how well your heart's chambers and valves are working. This procedure takes approximately one hour. There are no restrictions for this procedure. -this will be completed at our [facility] location: [place]: Your physician recommends that you schedule a follow-up appointment in: November with Dr. Claiborne Billings.    Any Other Special Instructions Will Be Listed Below (If Applicable).     If you need a refill on your cardiac medications before your next appointment, please call your pharmacy.

## 2017-05-07 NOTE — Progress Notes (Signed)
Cardiology Office Note    Date:  05/09/2017   ID:  Allison Guerrero, DOB 29-Jun-1956, MRN [id number]  PCP:  Allison Gravel, MD  Cardiologist:  Allison Majestic, MD   New consultation  History of Present Illness:  Allison Guerrero is a 61 y.o. female who is referred through the courtesy of Allison Guerrero and Allison Guerrero who has a history of mitral valve prolapse and recently has noticed increasing swelling of her hands, feet, ankles and legs.  Allison Guerrero currently resides in [place] since her husband is a Hydrologist for a consulting group in [place].  She states that she was told of having mitral valve prolapse over 15-20 years ago.  Recently, she has started to notice some mild weight gain and also some swelling of her hands, feet, ankles, and at times her legs.  She has a history of cervical disc disease and had undergone prior fusion of C4-5 and 6.  She denies any history of osteoarthritis or rheumatoid arthritis. She admits to significant daytime sleepiness and can not drive on the highway because she will fall sleep.  She had volunteered in [place] on 3 occasions and in 2009 contracted loa loa for which she has undergone treatment at the [facility].  She has a history of migraine headaches.  She denies any visual symptoms.  Because of her daytime sleepiness, apparently, she underwent a home sleep study and was recently told that she had moderate sleep apnea.  We had contacted Allison Guerrero office and  a verbal report indicates an AHI of 21.8, and oxygen nadir down to 78%.  The patient states that her insurance company had initially denied potential for CPAP, but she is uncertain as to the specifics.  She will be returning to [place] in 2 weeks.  She denies any history of chest pain.  She is unaware of palpitations.  She denies any episodes of presyncope or syncope.  She has been trying to reduce her sodium intake.  She was recently given a prescription for Lasix for edema.  She has a history of hyperlipidemia, and remotely had  been on pravastatin 80 mg per recently was switched to to atorvastatin 10 mg.  She also has a history of restless leg syndrome for which she has been on Mirapex 1 mg daily.  She presents for evaluation.   Epworth Sleepiness Scale: Situation   Chance of Dozing/Sleeping (0 = never , 1 = slight chance , 2 = moderate chance , 3 = high chance )   sitting and reading 2   watching TV 3   sitting inactive in a public place 3   being a passenger in a motor vehicle for an hour or more 3   lying down in the afternoon 2   sitting and talking to someone 1   sitting quietly after lunch (no alcohol) 1   while stopped for a few minutes in traffic as the driver 2   Total Score  17     Past Medical History:  Diagnosis Date  . Arthritis   . Blood transfusion    1983 r/t postpartum hemorrhage  . Cancer ([place])    cervical 20 yrs ago  . Chronic kidney disease    kidney stones  . Depression   . Headache(784.0)    migraines  . Heart murmur   . History of kidney stones    resolved with lithotripsy  . Hyperlipidemia   . Mitral valve prolapse    very minor; no surgical intervention  needed at this time    Past Surgical History:  Procedure Laterality Date  . ANTERIOR CERVICAL DECOMP/DISCECTOMY FUSION N/A 04/29/2013   Procedure: ANTERIOR CERVICAL DECOMPRESSION/DISCECTOMY FUSION 3 LEVELS;  Surgeon: Allison Ship, MD;  Location: [facility];  Service: Orthopedics;  Laterality: N/A;  Anterior cervical decompression fusion, cervical 4-5, cervical 5-6, cervical 6-7 wtih instrumentation and allograft  . BLEPHAROPLASTY Bilateral 20112  . BREAST SURGERY Bilateral 1986   implants  . BREAST SURGERY Bilateral 1994   ruptured and replaced  . BREAST SURGERY Bilateral 2013   lift and replaced with smaller breast  . EYE SURGERY    . LITHOTRIPSY  2013  . TONSILLECTOMY      Current Medications: Outpatient Medications Prior to Visit  Medication Sig Dispense Refill  . LORazepam (ATIVAN) 2 MG tablet Take 2 mg  by mouth at bedtime.      . pramipexole (MIRAPEX) 1 MG tablet Take 1 mg by mouth daily.    . pravastatin (PRAVACHOL) 80 MG tablet Take 80 mg by mouth daily.    . SUMAtriptan (IMITREX) 100 MG tablet Take 100 mg by mouth as needed.     . topiramate (TOPAMAX) 50 MG tablet Take 50 mg by mouth daily.      . Vilazodone HCl (VIIBRYD) 20 MG TABS Take 20 mg by mouth daily.    . Calcium Carbonate-Vitamin D (CALCIUM-VITAMIN D) 500-200 MG-UNIT per tablet Take 1 tablet by mouth 2 (two) times daily with a meal.      . estrogen, conjugated,-medroxyprogesterone (PREMPRO) 0.625-2.5 MG per tablet Take 1 tablet by mouth daily.      Marland Kitchen HYDROcodone-acetaminophen (NORCO) 7.5-325 MG per tablet Take 1-2 tablets by mouth every 6 (six) hours as needed for pain. 30 tablet 0   No facility-administered medications prior to visit.      Allergies:   Patient has no known allergies.   Social History   Social History  . Marital status: Married    Spouse name: N/A  . Number of children: N/A  . Years of education: N/A   Social History Main Topics  . Smoking status: Former Smoker    Packs/day: 1.00    Years: 20.00    Types: Cigarettes    Quit date: 04/29/2003  . Smokeless tobacco: Never Used  . Alcohol use No  . Drug use: No  . Sexual activity: Not Asked   Other Topics Concern  . None   Social History Narrative  . None    Additional social history is notable in that she is in her second marriage.  She is originally from the [place] area.  Her current husband is Allison Guerrero for a consulting group in [place].  She lives a proximally half the year in [place] and the remainder back in [place].  Family History:  The patient's family history includes Cervical cancer in her sister; Depression in her mother; Heart failure in her mother; Lung cancer in her father.   ROS General: Negative; No fevers, chills, or night sweats;  HEENT: Negative; No changes in vision or hearing, sinus congestion, difficulty swallowing Pulmonary:  Negative; No cough, wheezing, shortness of breath, hemoptysis Cardiovascular: Negative; No chest pain, presyncope, syncope, palpitations GI: Negative; No nausea, vomiting, diarrhea, or abdominal pain GU: Negative; No dysuria, hematuria, or difficulty voiding Musculoskeletal: Negative; no myalgias, joint pain, or weakness Infectious disease: Positive for prior Loa loa infection and recently undergoing treatment with hookworm therapy Hematologic/Oncology: Negative; no easy bruising, bleeding Endocrine: Negative; no heat/cold intolerance; no diabetes Neuro:  Positive for migraine headaches; no changes in balance,  Skin: Negative; No rashes or skin lesions Psychiatric: Negative; No behavioral problems, depression Sleep: Positive for significant daytime sleepiness.  hypersomnolence, restless legs; nohypnogognic hallucinations, no cataplexy Other comprehensive 14 point system review is negative.   PHYSICAL EXAM:   VS:  BP 104/66   Pulse 89   Ht '5\' 3"'  (1.6 m)   Wt 140 lb (63.5 kg)   BMI 24.80 kg/m     Repeat blood pressure by me 118/76  Wt Readings from Last 3 Encounters:  05/07/17 140 lb (63.5 kg)  04/30/13 110 lb (49.9 kg)  04/28/13 110 lb (49.9 kg)    General: Alert, oriented, no distress.  Skin: normal turgor, no rashes, warm and dry HEENT: Normocephalic, atraumatic. Pupils equal round and reactive to light; sclera anicteric; extraocular muscles intact; Fundi No hemorrhages or exudates; disc flat Nose without nasal septal hypertrophy Mouth/Parynx:  Elongated uvula;  Mallinpatti scale 3 Neck: No JVD, no carotid bruits; normal carotid upstroke Lungs: clear to ausculatation and percussion; no wheezing or rales Chest wall: without tenderness to palpitation Heart: PMI not displaced, RRR, s1 s2 normal, trace to 1/6 systolic murmur, no diastolic murmur, no rubs, gallops, thrills, or heaves.  I did not appreciate any systolic clicks. Abdomen: soft, nontender; no hepatosplenomehaly, BS+;  abdominal aorta nontender and not dilated by palpation. Back: no CVA tenderness Pulses 2+ Musculoskeletal: full range of motion, normal strength, no joint deformities Extremities: no clubbing cyanosis or edema, Homan's sign negative  Neurologic: grossly nonfocal; Cranial nerves grossly wnl Psychologic: Normal mood and affect   Studies/Labs Reviewed:   EKG:  EKG is ordered today.  ECG (independently read by me): normal sinus rhythm at 89 bpm.  Possible left atrial enlargement.  Normal intervals.  Mild RV conduction delay.  Recent Labs:  I have reviewed recent lab work done at [facility]: 04/25/2017 Cholesterol 159, triglycerides 84, HDL 58, LDL 84. BUN 15, creatinine 0.8.  LFTs normal. Hemoglobin 12.9, hematocrit 39.7, white blood count 7.1, platelets 377K.  BMP Latest Ref Rng & Units 04/28/2013 10/06/2007 10/06/2007  Glucose 70 - 99 mg/dL 66(L) 123(H) 152 PATIENT IDENTIFICATION ERROR. PLEASE DISREGARD RESULTS. ACCOUNT WILL BE CREDITED.(H)  BUN 6 - 23 mg/dL '12 10 26 ' PATIENT IDENTIFICATION ERROR. PLEASE DISREGARD RESULTS. ACCOUNT WILL BE CREDITED.(H)  Creatinine 0.50 - 1.10 mg/dL 0.82 0.7 1.2 PATIENT IDENTIFICATION ERROR. PLEASE DISREGARD RESULTS. ACCOUNT WILL BE CREDITED.  Sodium 135 - 145 mEq/L 142 141 136 PATIENT IDENTIFICATION ERROR. PLEASE DISREGARD RESULTS. ACCOUNT WILL BE CREDITED.  Potassium 3.5 - 5.1 mEq/L 4.0 3.8 3.7 PATIENT IDENTIFICATION ERROR. PLEASE DISREGARD RESULTS. ACCOUNT WILL BE CREDITED.  Chloride 96 - 112 mEq/L 108 107 105 PATIENT IDENTIFICATION ERROR. PLEASE DISREGARD RESULTS. ACCOUNT WILL BE CREDITED.  CO2 19 - 32 mEq/L 25 - -  Calcium 8.4 - 10.5 mg/dL 9.5 - -     Hepatic Function Latest Ref Rng & Units 04/28/2013  Total Protein 6.0 - 8.3 g/dL 6.5  Albumin 3.5 - 5.2 g/dL 3.8  AST 0 - 37 U/L 18  ALT 0 - 35 U/L 17  Alk Phosphatase 39 - 117 U/L 50  Total Bilirubin 0.3 - 1.2 mg/dL <0.1(L)    CBC Latest Ref Rng & Units 04/28/2013 10/17/2007 10/15/2007   WBC 4.0 - 10.5 K/uL 5.7 8.0 10.8(H)  Hemoglobin 12.0 - 15.0 g/dL 13.6 13.4 13.7  Hematocrit 36.0 - 46.0 % 39.4 40.0 41.1  Platelets 150 - 400 K/uL 297 320 339  Lab Results  Component Value Date   MCV 91.4 04/28/2013   MCV 92.0 10/17/2007   MCV 91.8 10/15/2007   No results found for: TSH No results found for: HGBA1C   BNP No results found for: BNP  ProBNP No results found for: PROBNP   Lipid Panel  No results found for: CHOL, TRIG, HDL, CHOLHDL, VLDL, LDLCALC, LDLDIRECT   RADIOLOGY: No results found.   Additional studies/ records that were reviewed today include:  I reviewed the records from [facility]:    1. MVP (mitral valve prolapse)   2. Edema, unspecified type   3. Hyperlipidemia, unspecified hyperlipidemia type   4. Excessive daytime sleepiness   5. OSA (obstructive sleep apnea)   6. Restless leg syndrome   7. History of migraine headaches   8. Disc disease, degenerative, cervical      PLAN:  Ms. Allison Guerrero is a very pleasant 61 year old female who reportedly was diagnosed with mitral valve prolapse 15-20 years ago.  She does not recall having had a recent echo Doppler study in many years.  She denies any history of significant palpitations.  She has noticed some recent mild weight gain and some intermittent swelling of her hands and feet.  Presently on exam today, she does not have any significant edema.  She appears to be euvolemic and does not have any signs of CHF.  I have recommended that she undergo a follow-up 2-D echo Doppler study for reevaluation of her mitral valve as well as to evaluate  systolic and diastolic function.  Upon further questioning, she has obstructive sleep apnea and on a recent home test.  She qualifies as having moderate sleep apnea.  She did have significant oxygen desaturation to a nadir of 78%.  She tells me she has not been able to get CPAP therapy.  She will be going to do by in 2 weeks.  She  has significant daytime sleepiness and an Epworth Sleepiness Scale score endorsed at our office today was significantly elevated at 17.  She is asked that I try to expedite the process and potentially follow her sleep issues.  Since she will be going out of the country in 2 weeks.  We will see we can arrange for her to have a CPAP auto home titration since she undoubtedly would not be able to undergo an in lab CPAP titration within the next week, lasts there was significant cancellation.  She will be returning back to [place] in early November.  Since her sister will be undergoing surgery and she played plans to be here for that.  We will try to contact the DME company to see if she can have a home titration study and we can then monitor this and see her back in follow-up when she returns in a month.  She has a prescription for Lasix for which he has been taking and I recommended that she continue to take this presently.  I discussed importance of sodium restriction.  She also has restless legs and has been on Mirapex for some time with improvement.  Clinically, she is not having any anginal symptomatology.  She does yoga and plans to return doing this when she returns back to [place].  From a cardiac standpoint, she appears to be stable   Medication Adjustments/Labs and Tests Ordered: Current medicines are reviewed at length with the patient today.  Concerns regarding medicines are outlined above.  Medication changes, Labs and Tests ordered today are listed in  the Patient Instructions below.  Patient Instructions  Medication Instructions:  Your physician recommends that you continue on your current medications as directed. Please refer to the Current Medication list given to you today.  Testing/Procedures: Your physician has requested that you have an echocardiogram ASAP. Echocardiography is a painless test that uses sound waves to create images of your heart. It provides your doctor with information  about the size and shape of your heart and how well your heart's chambers and valves are working. This procedure takes approximately one hour. There are no restrictions for this procedure. -this will be completed at our [facility] location: [place]: Your physician recommends that you schedule a follow-up appointment in: November with Dr. Claiborne Billings.    Any Other Special Instructions Will Be Listed Below (If Applicable).     If you need a refill on your cardiac medications before your next appointment, please call your pharmacy.      Signed, Allison Majestic, MD, [facility] 05/09/2017 9:53 PM    [facility] [address], [place], [place], [place]  [address] Phone: [phone]

## 2017-05-08 ENCOUNTER — Ambulatory Visit (HOSPITAL_COMMUNITY): Payer: 59 | Attending: Cardiovascular Disease

## 2017-05-08 ENCOUNTER — Other Ambulatory Visit: Payer: Self-pay

## 2017-05-08 DIAGNOSIS — E785 Hyperlipidemia, unspecified: Secondary | ICD-10-CM | POA: Insufficient documentation

## 2017-05-08 DIAGNOSIS — R609 Edema, unspecified: Secondary | ICD-10-CM | POA: Insufficient documentation

## 2017-05-08 DIAGNOSIS — I341 Nonrheumatic mitral (valve) prolapse: Secondary | ICD-10-CM

## 2017-05-08 DIAGNOSIS — N189 Chronic kidney disease, unspecified: Secondary | ICD-10-CM | POA: Diagnosis not present

## 2017-05-08 DIAGNOSIS — Z87891 Personal history of nicotine dependence: Secondary | ICD-10-CM | POA: Diagnosis not present

## 2017-05-09 ENCOUNTER — Telehealth: Payer: Self-pay | Admitting: *Deleted

## 2017-05-09 NOTE — Telephone Encounter (Signed)
[place] x 3 to have sleep study faxed to office in order to expedite CPAP set up before patient returns to [place] Oct 2nd.    Study has not been received.   Call patient to make aware that I am unable to send orders for set up until this is received.      Patient aware and will contact office to attempt to get sleep study.

## 2017-05-10 NOTE — Telephone Encounter (Signed)
Received call from [place] at [facility] and from patient.  Patient did not have sleep study, she had a apnea link which has been faxed over to our office.   Spoke with patient and verbalized that she may need an actual sleep study in order for [place] to get her set up for CPAP.   Advised I need to speak with Dr. Claiborne Billings and will let her know.

## 2017-05-13 NOTE — Telephone Encounter (Signed)
Spoke to [place] at [employer] has an exclusion to CPAP.     Ivin Booty with [employer] will contact patient to inform and discuss options.

## 2017-05-13 NOTE — Telephone Encounter (Signed)
Spoke to [place] at [employer] attempt to get processed and approved by insurance.     Orders faxed to [employer] for CPAP auto titration

## 2017-05-27 ENCOUNTER — Telehealth: Payer: Self-pay | Admitting: Cardiovascular Disease

## 2017-05-27 NOTE — Telephone Encounter (Signed)
Received records from [facility] for appointment on 07/08/17 with Dr Claiborne Billings.  Records put with Dr Evette Georges schedule for 07/08/17. lp

## 2017-05-28 ENCOUNTER — Ambulatory Visit: Payer: 59 | Admitting: Cardiovascular Disease

## 2017-07-08 ENCOUNTER — Ambulatory Visit (INDEPENDENT_AMBULATORY_CARE_PROVIDER_SITE_OTHER): Payer: 59 | Admitting: Cardiovascular Disease

## 2017-07-08 ENCOUNTER — Encounter (INDEPENDENT_AMBULATORY_CARE_PROVIDER_SITE_OTHER): Payer: Self-pay

## 2017-07-08 ENCOUNTER — Encounter: Payer: Self-pay | Admitting: Cardiovascular Disease

## 2017-07-08 VITALS — BP 120/76 | HR 88 | Ht 63.0 in | Wt 134.0 lb

## 2017-07-08 DIAGNOSIS — Z8669 Personal history of other diseases of the nervous system and sense organs: Secondary | ICD-10-CM

## 2017-07-08 DIAGNOSIS — G4733 Obstructive sleep apnea (adult) (pediatric): Secondary | ICD-10-CM | POA: Diagnosis not present

## 2017-07-08 DIAGNOSIS — R609 Edema, unspecified: Secondary | ICD-10-CM

## 2017-07-08 DIAGNOSIS — I341 Nonrheumatic mitral (valve) prolapse: Secondary | ICD-10-CM

## 2017-07-08 DIAGNOSIS — G4719 Other hypersomnia: Secondary | ICD-10-CM

## 2017-07-08 NOTE — Progress Notes (Signed)
Cardiology Office Note    Date:  07/14/2017   ID:  Allison Guerrero, DOB [birth year], MRN [id number]  PCP:  Jani Gravel, MD  Cardiologist:  Shelva Majestic, MD   Follow-up evaluation.  History of Present Illness:  Allison Guerrero is a 61 y.o. female who was referred through the courtesy of Dr. Maudie Mercury and Lilyan Gilford, PA.  I saw her for initial consultation on 05/07/2017. Marland Kitchen  She has a history of mitral valve prolapse and recently has noticed increasing swelling of her hands, feet, ankles and legs.   She was also diagnosed as having sleep apnea.  She presents for follow-up evaluation  Allison Guerrero currently resides in [place] since her husband is a Hydrologist for a Financial risk analyst group in [place].  She states that she was told of having mitral valve prolapse over 15-20 years ago.  Recently, she has started to notice some mild weight gain and also some swelling of her hands, feet, ankles, and at times her legs.  She has a history of cervical disc disease and had undergone prior fusion of C4-5 and 6.  She denies any history of osteoarthritis or rheumatoid arthritis. She admits to significant daytime sleepiness and can not drive on the highway because she will fall sleep.  She had volunteered in [place] on 3 occasions and in 2009 contracted loa loa for which she has undergone treatment at the [facility].  She has a history of migraine headaches.  She denies any visual symptoms.  Because of her daytime sleepiness, apparently, she underwent a home sleep study and was recently told that she had moderate sleep apnea.  We had contacted Dr. Julianne Rice office and  a verbal report indicates an AHI of 21.8, and oxygen nadir down to 78%.  The patient states that her insurance company had initially denied potential for CPAP, but she is uncertain as to the specifics.  She will be returning to [place] in 2 weeks.  She denies any history of chest pain.  She is unaware of palpitations.  She denies any episodes of presyncope or syncope.  She has been  trying to reduce her sodium intake.  She was recently given a prescription for Lasix for edema.  She has a history of hyperlipidemia, and remotely had been on pravastatin 80 mg per recently was switched to to atorvastatin 10 mg.  She also has a history of restless leg syndrome for which she has been on Mirapex 1 mg daily.  When I saw her, she had significant daytime sleepiness as assessed by her Epworth Sleepiness Scale score.  I found out that she had undergone a home sleep study in which she was found to have , moderate obstructive sleep apnea with an AHI of 21.8 per hour.  The supine position.  AHI was 24.2 per hour.  She had oxygen desaturation to a nadir of 78%.  She was recommended to under have a CPAP titration and when I last saw her, we attempted to perform this either.  Prior to her return to [place] or as an auto titration.  Apparently, her insurance company seems to exclude sleep apnea treatment..  She is now returned to the [place] to be here when her sister had breast surgery. Epworth Sleepiness Scale: Situation   Chance of Dozing/Sleeping (0 = never , 1 = slight chance , 2 = moderate chance , 3 = high chance )   sitting and reading 2   watching TV 3   sitting inactive in a public place 3  being a passenger in a motor vehicle for an hour or more 3   lying down in the afternoon 2   sitting and talking to someone 1   sitting quietly after lunch (no alcohol) 1   while stopped for a few minutes in traffic as the driver 2   Total Score  17   Since I initially saw her, she underwent a 2-D echo Doppler study on 05/08/2017 which showed an EF of 65-70%.  She had normal wall motion.  She had normal diastolic parameters.  There was mild mitral valve prolapse involving the anterior mitral valve leaflet without regurgitation.  She continues to experience fatigue and at times intermittent swelling.  She has GERD which is controlled with omeprazole.  She presents for reevaluation.  Past Medical History:    Diagnosis Date  . Arthritis   . Blood transfusion    1983 r/t postpartum hemorrhage  . Cancer ([place])    cervical 20 yrs ago  . Chronic kidney disease    kidney stones  . Depression   . Headache(784.0)    migraines  . Heart murmur   . History of kidney stones    resolved with lithotripsy  . Hyperlipidemia   . Mitral valve prolapse    very minor; no surgical intervention needed at this time    Past Surgical History:  Procedure Laterality Date  . ANTERIOR CERVICAL DECOMP/DISCECTOMY FUSION N/A 04/29/2013   Procedure: ANTERIOR CERVICAL DECOMPRESSION/DISCECTOMY FUSION 3 LEVELS;  Surgeon: Sinclair Ship, MD;  Location: [facility];  Service: Orthopedics;  Laterality: N/A;  Anterior cervical decompression fusion, cervical 4-5, cervical 5-6, cervical 6-7 wtih instrumentation and allograft  . BLEPHAROPLASTY Bilateral 20112  . BREAST SURGERY Bilateral 1986   implants  . BREAST SURGERY Bilateral 1994   ruptured and replaced  . BREAST SURGERY Bilateral 2013   lift and replaced with smaller breast  . EYE SURGERY    . LITHOTRIPSY  2013  . TONSILLECTOMY      Current Medications: Outpatient Medications Prior to Visit  Medication Sig Dispense Refill  . Furosemide (LASIX PO) Take 1 tablet by mouth daily.    Marland Kitchen LORazepam (ATIVAN) 2 MG tablet Take 2 mg by mouth at bedtime.      . pramipexole (MIRAPEX) 1 MG tablet Take 1 mg by mouth daily.    . pravastatin (PRAVACHOL) 80 MG tablet Take 80 mg by mouth daily.    . SUMAtriptan (IMITREX) 100 MG tablet Take 100 mg by mouth as needed.     . topiramate (TOPAMAX) 50 MG tablet Take 50 mg by mouth daily.      . Vilazodone HCl (VIIBRYD) 20 MG TABS Take 20 mg by mouth daily.    Marland Kitchen albendazole (ALBENZA) 200 MG tablet Take 400 mg by mouth 3 (three) times daily.     No facility-administered medications prior to visit.      Allergies:   Patient has no known allergies.   Social History   Socioeconomic History  . Marital status: Married    Spouse  name: None  . Number of children: None  . Years of education: None  . Highest education level: None  Social Needs  . Financial resource strain: None  . Food insecurity - worry: None  . Food insecurity - inability: None  . Transportation needs - medical: None  . Transportation needs - non-medical: None  Occupational History  . None  Tobacco Use  . Smoking status: Former Smoker    Packs/day: 1.00  Years: 20.00    Pack years: 20.00    Types: Cigarettes    Last attempt to quit: 04/29/2003    Years since quitting: 14.2  . Smokeless tobacco: Never Used  Substance and Sexual Activity  . Alcohol use: No  . Drug use: No  . Sexual activity: None  Other Topics Concern  . None  Social History Narrative  . None    Additional social history is notable in that she is in her second marriage.  She is originally from the [place] area.  Her current husband is CFO for a consulting group in [place].  She lives a proximally half the year in [place] and the remainder back in [place].  Family History:  The patient's family history includes Cervical cancer in her sister; Depression in her mother; Heart failure in her mother; Lung cancer in her father.   ROS General: Negative; No fevers, chills, or night sweats;  HEENT: Negative; No changes in vision or hearing, sinus congestion, difficulty swallowing Pulmonary: Negative; No cough, wheezing, shortness of breath, hemoptysis Cardiovascular: Negative; No chest pain, presyncope, syncope, palpitations GI: Negative; No nausea, vomiting, diarrhea, or abdominal pain GU: Negative; No dysuria, hematuria, or difficulty voiding Musculoskeletal: Negative; no myalgias, joint pain, or weakness Infectious disease: Positive for prior Loa loa infection and recently undergoing treatment with hookworm therapy Hematologic/Oncology: Negative; no easy bruising, bleeding Endocrine: Negative; no heat/cold intolerance; no diabetes Neuro: Positive for migraine headaches;  no changes in balance,  Skin: Negative; No rashes or skin lesions Psychiatric: Negative; No behavioral problems, depression Sleep: Positive for significant daytime sleepiness.  hypersomnolence, restless legs; nohypnogognic hallucinations, no cataplexy Other comprehensive 14 point system review is negative.   PHYSICAL EXAM:   VS:  BP 120/76   Pulse 88   Ht '5\' 3"'  (1.6 m)   Wt 134 lb (60.8 kg)   BMI 23.74 kg/m     Repeat blood pressure by me 102/62  Wt Readings from Last 3 Encounters:  07/08/17 134 lb (60.8 kg)  05/07/17 140 lb (63.5 kg)  04/30/13 110 lb (49.9 kg)    General: Alert, oriented, no distress.  Skin: normal turgor, no rashes, warm and dry HEENT: Normocephalic, atraumatic. Pupils equal round and reactive to light; sclera anicteric; extraocular muscles intact; Fundi No hemorrhages or exudates; disc flat Nose without nasal septal hypertrophy Mouth/Parynx:  Elongated uvula;  Mallinpatti scale 3 Neck: No JVD, no carotid bruits; normal carotid upstroke Lungs: clear to ausculatation and percussion; no wheezing or rales Chest wall: without tenderness to palpitation Heart: PMI not displaced, RRR, s1 s2 normal, trace systolic murmur, no diastolic murmur, no rubs, gallops, thrills, or heaves.  I did not appreciate any systolic clicks. Abdomen: soft, nontender; no hepatosplenomehaly, BS+; abdominal aorta nontender and not dilated by palpation. Back: no CVA tenderness Pulses 2+ Musculoskeletal: full range of motion, normal strength, no joint deformities Extremities: no clubbing cyanosis or edema, Homan's sign negative  Neurologic: grossly nonfocal; Cranial nerves grossly wnl Psychologic: Normal mood and affect   Studies/Labs Reviewed:   EKG:  EKG is ordered today.  Normal sinus rhythm at 88 bpm.  No ectopy.  QTc interval 469 ms.  05/07/2017 ECG (independently read by me): normal sinus rhythm at 89 bpm.  Possible left atrial enlargement.  Normal intervals.  Mild RV conduction  delay.  Recent Labs:  I have reviewed recent lab work done at [facility]: 04/25/2017 Cholesterol 159, triglycerides 84, HDL 58, LDL 84. BUN 15, creatinine 0.8.  LFTs normal. Hemoglobin 12.9,  hematocrit 39.7, white blood count 7.1, platelets 377K.  BMP Latest Ref Rng & Units 04/28/2013 10/06/2007 10/06/2007  Glucose 70 - 99 mg/dL 66(L) 123(H) 152 PATIENT IDENTIFICATION ERROR. PLEASE DISREGARD RESULTS. ACCOUNT WILL BE CREDITED.(H)  BUN 6 - 23 mg/dL '12 10 26 ' PATIENT IDENTIFICATION ERROR. PLEASE DISREGARD RESULTS. ACCOUNT WILL BE CREDITED.(H)  Creatinine 0.50 - 1.10 mg/dL 0.82 0.7 1.2 PATIENT IDENTIFICATION ERROR. PLEASE DISREGARD RESULTS. ACCOUNT WILL BE CREDITED.  Sodium 135 - 145 mEq/L 142 141 136 PATIENT IDENTIFICATION ERROR. PLEASE DISREGARD RESULTS. ACCOUNT WILL BE CREDITED.  Potassium 3.5 - 5.1 mEq/L 4.0 3.8 3.7 PATIENT IDENTIFICATION ERROR. PLEASE DISREGARD RESULTS. ACCOUNT WILL BE CREDITED.  Chloride 96 - 112 mEq/L 108 107 105 PATIENT IDENTIFICATION ERROR. PLEASE DISREGARD RESULTS. ACCOUNT WILL BE CREDITED.  CO2 19 - 32 mEq/L 25 - -  Calcium 8.4 - 10.5 mg/dL 9.5 - -     Hepatic Function Latest Ref Rng & Units 04/28/2013  Total Protein 6.0 - 8.3 g/dL 6.5  Albumin 3.5 - 5.2 g/dL 3.8  AST 0 - 37 U/L 18  ALT 0 - 35 U/L 17  Alk Phosphatase 39 - 117 U/L 50  Total Bilirubin 0.3 - 1.2 mg/dL <0.1(L)    CBC Latest Ref Rng & Units 04/28/2013 10/17/2007 10/15/2007  WBC 4.0 - 10.5 K/uL 5.7 8.0 10.8(H)  Hemoglobin 12.0 - 15.0 g/dL 13.6 13.4 13.7  Hematocrit 36.0 - 46.0 % 39.4 40.0 41.1  Platelets 150 - 400 K/uL 297 320 339   Lab Results  Component Value Date   MCV 91.4 04/28/2013   MCV 92.0 10/17/2007   MCV 91.8 10/15/2007   No results found for: TSH No results found for: HGBA1C   BNP No results found for: BNP  ProBNP No results found for: PROBNP   Lipid Panel  No results found for: CHOL, TRIG, HDL, CHOLHDL, VLDL, LDLCALC, LDLDIRECT   RADIOLOGY: No results  found.   Additional studies/ records that were reviewed today include:  I reviewed the [facility] from [facility]; I reviewed her home sleep study.  I reviewed and discussed her echo Doppler evaluation.  ------------------------------------------------------------------- ECHO Study Conclusions  - Left ventricle: The cavity size was normal. Wall thickness was   normal. Systolic function was vigorous. The estimated ejection   fraction was in the range of 65% to 70%. Wall motion was normal;   there were no regional wall motion abnormalities. Left   ventricular diastolic function parameters were normal. - Mitral valve: Mild prolapse, involving the anterior leaflet.  ASSESSMENT:    1. MVP (mitral valve prolapse)   2. Excessive daytime sleepiness   3. OSA (obstructive sleep apnea)   4. History of migraine headaches   5. Edema, unspecified type      PLAN:  Allison Guerrero is a very pleasant 61 year old female who reportedly was diagnosed with mitral valve prolapse 15-20 years ago.  She underwent a repeat echo Doppler study on 05/08/2017 which showed normal systolic and diastolic function with hyperdynamic function with an EF of 65-70%.  There was anterior mitral valve prolapse without regurgitation.  She had normal diastolic parameters.  Her blood pressure today is stable and on the low side at 102/62 when checked by me.  On exam she does not appear to have any significant swelling.  Presently, but she states that time, she is bothered by intermittent edema.  I did reviewed her outpatient home sleep study in which she tested positive for moderate obstructive sleep apnea with significant oxygen desaturation  to 78%.  With her present severity of sleep apnea, it is my strong recommendation that her insurance cover treatment including CPAP titration.  I will try to let right a letter on behalf of.  I discussed the risks and potential adverse contour is a months, treated sleep  apnea with reference to future cardiovascular health.  She's not having any chest pain or anginal symptoms.  She has GERD which is controlled with omeprazole.  She will be returning back to [place].  I will see her in follow-up depending upon when she is back in the [place].    Medication Adjustments/Labs and Tests Ordered: Current medicines are reviewed at length with the patient today.  Concerns regarding medicines are outlined above.  Medication changes, Labs and Tests ordered today are listed in the Patient Instructions below.  Patient Instructions  Medication Instructions:  Your physician recommends that you continue on your current medications as directed. Please refer to the Current Medication list given to you today.  Follow-Up: Your physician wants you to follow-up in: June with Dr. Claiborne Billings. You will receive a reminder letter in the mail two months in advance. If you don't receive a letter, please call our office to schedule the follow-up appointment.   Any Other Special Instructions Will Be Listed Below (If Applicable).     If you need a refill on your cardiac medications before your next appointment, please call your pharmacy.      Signed, Shelva Majestic, MD, [facility] 07/14/2017 7:40 PM    [facility] [address], [address], [place], [place]  [address] Phone: [phone]

## 2017-07-08 NOTE — Patient Instructions (Signed)
Medication Instructions:  Your physician recommends that you continue on your current medications as directed. Please refer to the Current Medication list given to you today.  Follow-Up: Your physician wants you to follow-up in: June with Dr. Claiborne Billings. You will receive a reminder letter in the mail two months in advance. If you don't receive a letter, please call our office to schedule the follow-up appointment.   Any Other Special Instructions Will Be Listed Below (If Applicable).     If you need a refill on your cardiac medications before your next appointment, please call your pharmacy.

## 2017-07-14 ENCOUNTER — Encounter: Payer: Self-pay | Admitting: Cardiovascular Disease

## 2017-07-16 ENCOUNTER — Telehealth: Payer: Self-pay | Admitting: Cardiovascular Disease

## 2017-07-16 NOTE — Telephone Encounter (Signed)
Follow up     Patient is calling to see if letter for insurance company is done yet? It was suppose to be emailed to her and she still has not received it.

## 2017-07-16 NOTE — Telephone Encounter (Signed)
Pt notified RN is out of the office and will return tomorrow. Letter is [facility]:CWCB

## 2017-07-18 NOTE — Telephone Encounter (Signed)
Letter is done.

## 2017-07-19 NOTE — Telephone Encounter (Signed)
Patient aware.  Letter and OV note placed at front desk for patient to pick up.  Patient verbalized understanding.

## 2017-07-24 ENCOUNTER — Telehealth: Payer: Self-pay | Admitting: Cardiovascular Disease

## 2017-07-24 NOTE — Telephone Encounter (Signed)
New message    Patient calling for names of companies to get a cpap machine from.  She wants to know who  [facility] recommends. Patient states her insurance will not cover the cost  Please call

## 2017-07-24 NOTE — Telephone Encounter (Signed)
Spoke to patient who states insurance reviewed letter and will not cover CPAP.   Wondering what company to use to try to get CPAP.   Advised I would contact [employer] (who original orders were sent to) to contact her to discuss options and set up.  Patient aware and verbalized understanding.

## 2017-08-05 ENCOUNTER — Telehealth: Payer: Self-pay | Admitting: Cardiovascular Disease

## 2017-08-05 NOTE — Telephone Encounter (Signed)
°  Follow Up  Pt calling to follow up on CPAP machine that was ordered for her. States she was out of the country last week but has not been contacted. Please call.

## 2017-08-05 NOTE — Telephone Encounter (Signed)
Per [employer]-left message last week and have not received call back.   Spoke to patient, patient states she was in [place] and just got back, therefore did not get message.  # to [employer] given to patient, patient will call.

## 2018-05-19 ENCOUNTER — Encounter

## 2018-08-27 ENCOUNTER — Ambulatory Visit (HOSPITAL_COMMUNITY)
Admission: RE | Admit: 2018-08-27 | Discharge: 2018-08-27 | Disposition: A | Discharge status: Home / Self Care | Payer: 59 | Attending: Psychiatry | Admitting: Psychiatry

## 2019-05-18 ENCOUNTER — Telehealth: Payer: Self-pay | Admitting: Cardiovascular Disease

## 2019-05-18 NOTE — Telephone Encounter (Signed)
Patient called stating she needs new hose for her CPAP. She wants to know if Dr. Claiborne Billings can send a script in for her.

## 2019-05-22 NOTE — Telephone Encounter (Signed)
Returned a call to patient. She is in route out of the country. She states she will make appointment to be seen the next time she is back in the country.

## 2020-01-04 ENCOUNTER — Other Ambulatory Visit: Payer: Self-pay | Admitting: Internal Medicine

## 2020-01-04 DIAGNOSIS — Z1231 Encounter for screening mammogram for malignant neoplasm of breast: Secondary | ICD-10-CM

## 2020-01-06 ENCOUNTER — Ambulatory Visit
Admission: RE | Admit: 2020-01-06 | Discharge: 2020-01-06 | Disposition: A | Discharge status: Home / Self Care | Payer: 59 | Source: Ambulatory Visit | Attending: Internal Medicine | Admitting: Internal Medicine

## 2020-01-06 ENCOUNTER — Other Ambulatory Visit: Payer: Self-pay

## 2020-01-06 DIAGNOSIS — Z1231 Encounter for screening mammogram for malignant neoplasm of breast: Secondary | ICD-10-CM

## 2020-01-08 ENCOUNTER — Other Ambulatory Visit: Payer: Self-pay | Admitting: Internal Medicine

## 2020-01-08 DIAGNOSIS — R928 Other abnormal and inconclusive findings on diagnostic imaging of breast: Secondary | ICD-10-CM

## 2020-01-13 ENCOUNTER — Other Ambulatory Visit: Payer: Self-pay | Admitting: Internal Medicine

## 2020-01-13 ENCOUNTER — Other Ambulatory Visit: Payer: Self-pay

## 2020-01-13 ENCOUNTER — Ambulatory Visit
Admission: RE | Admit: 2020-01-13 | Discharge: 2020-01-13 | Disposition: A | Discharge status: Home / Self Care | Payer: 59 | Source: Ambulatory Visit | Attending: Internal Medicine | Admitting: Internal Medicine

## 2020-01-13 ENCOUNTER — Ambulatory Visit: Payer: 59

## 2020-01-13 DIAGNOSIS — R921 Mammographic calcification found on diagnostic imaging of breast: Secondary | ICD-10-CM

## 2020-01-13 DIAGNOSIS — R928 Other abnormal and inconclusive findings on diagnostic imaging of breast: Secondary | ICD-10-CM

## 2020-01-14 ENCOUNTER — Other Ambulatory Visit: Payer: Self-pay | Admitting: Internal Medicine

## 2020-01-14 ENCOUNTER — Ambulatory Visit
Admission: RE | Admit: 2020-01-14 | Discharge: 2020-01-14 | Disposition: A | Discharge status: Home / Self Care | Payer: 59 | Source: Ambulatory Visit | Attending: Internal Medicine | Admitting: Internal Medicine

## 2020-01-14 DIAGNOSIS — R921 Mammographic calcification found on diagnostic imaging of breast: Secondary | ICD-10-CM

## 2020-01-14 HISTORY — PX: BREAST BIOPSY: SHX20

## 2020-02-29 ENCOUNTER — Encounter: Payer: Self-pay | Admitting: General Practice

## 2020-11-22 ENCOUNTER — Other Ambulatory Visit: Payer: Self-pay | Admitting: Family Medicine

## 2020-11-22 DIAGNOSIS — E7800 Pure hypercholesterolemia, unspecified: Secondary | ICD-10-CM

## 2021-01-03 ENCOUNTER — Ambulatory Visit
Admission: RE | Admit: 2021-01-03 | Discharge: 2021-01-03 | Disposition: A | Discharge status: Home / Self Care | Payer: 59 | Source: Ambulatory Visit | Attending: Family Medicine | Admitting: Family Medicine

## 2021-01-03 DIAGNOSIS — E7800 Pure hypercholesterolemia, unspecified: Secondary | ICD-10-CM

## 2021-02-10 ENCOUNTER — Other Ambulatory Visit: Payer: Self-pay | Admitting: Internal Medicine

## 2021-02-10 DIAGNOSIS — Z1231 Encounter for screening mammogram for malignant neoplasm of breast: Secondary | ICD-10-CM

## 2021-03-31 ENCOUNTER — Other Ambulatory Visit: Payer: Self-pay | Admitting: Family Medicine

## 2021-03-31 DIAGNOSIS — Z8249 Family history of ischemic heart disease and other diseases of the circulatory system: Secondary | ICD-10-CM

## 2021-04-03 ENCOUNTER — Encounter: Payer: Self-pay | Admitting: Gastroenterology

## 2021-04-04 ENCOUNTER — Ambulatory Visit: Payer: No Typology Code available for payment source

## 2021-04-21 ENCOUNTER — Ambulatory Visit
Admission: RE | Admit: 2021-04-21 | Discharge: 2021-04-21 | Disposition: A | Discharge status: Home / Self Care | Payer: No Typology Code available for payment source | Source: Ambulatory Visit | Attending: Family Medicine | Admitting: Family Medicine

## 2021-04-21 ENCOUNTER — Other Ambulatory Visit: Payer: Self-pay

## 2021-04-21 DIAGNOSIS — Z8249 Family history of ischemic heart disease and other diseases of the circulatory system: Secondary | ICD-10-CM

## 2021-04-26 ENCOUNTER — Ambulatory Visit
Admission: RE | Admit: 2021-04-26 | Discharge: 2021-04-26 | Disposition: A | Discharge status: Home / Self Care | Payer: No Typology Code available for payment source | Source: Ambulatory Visit | Attending: Internal Medicine | Admitting: Internal Medicine

## 2021-04-26 ENCOUNTER — Other Ambulatory Visit: Payer: Self-pay

## 2021-04-26 DIAGNOSIS — Z1231 Encounter for screening mammogram for malignant neoplasm of breast: Secondary | ICD-10-CM

## 2021-06-21 ENCOUNTER — Other Ambulatory Visit: Payer: Self-pay

## 2021-06-21 ENCOUNTER — Emergency Department (HOSPITAL_BASED_OUTPATIENT_CLINIC_OR_DEPARTMENT_OTHER): Payer: Managed Care, Other (non HMO) | Admitting: Radiology

## 2021-06-21 ENCOUNTER — Encounter (HOSPITAL_BASED_OUTPATIENT_CLINIC_OR_DEPARTMENT_OTHER): Payer: Self-pay

## 2021-06-21 DIAGNOSIS — Z85828 Personal history of other malignant neoplasm of skin: Secondary | ICD-10-CM | POA: Insufficient documentation

## 2021-06-21 DIAGNOSIS — Z87891 Personal history of nicotine dependence: Secondary | ICD-10-CM | POA: Insufficient documentation

## 2021-06-21 DIAGNOSIS — N189 Chronic kidney disease, unspecified: Secondary | ICD-10-CM | POA: Insufficient documentation

## 2021-06-21 DIAGNOSIS — S299XXA Unspecified injury of thorax, initial encounter: Secondary | ICD-10-CM | POA: Diagnosis present

## 2021-06-21 DIAGNOSIS — S298XXA Other specified injuries of thorax, initial encounter: Secondary | ICD-10-CM | POA: Insufficient documentation

## 2021-06-21 DIAGNOSIS — Y92410 Unspecified street and highway as the place of occurrence of the external cause: Secondary | ICD-10-CM | POA: Insufficient documentation

## 2021-06-21 MED ORDER — OXYCODONE-ACETAMINOPHEN 5-325 MG PO TABS
1.0000 | ORAL_TABLET | ORAL | Status: DC | PRN
  Administered 2021-06-21: 1 via ORAL
  Filled 2021-06-21: qty 1
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
## 2021-06-22 ENCOUNTER — Emergency Department (HOSPITAL_BASED_OUTPATIENT_CLINIC_OR_DEPARTMENT_OTHER): Payer: Managed Care, Other (non HMO)

## 2021-06-22 ENCOUNTER — Emergency Department (HOSPITAL_BASED_OUTPATIENT_CLINIC_OR_DEPARTMENT_OTHER)
Admission: EM | Admit: 2021-06-22 | Discharge: 2021-06-22 | Disposition: A | Discharge status: Home / Self Care | Payer: Managed Care, Other (non HMO) | Attending: Emergency Medicine | Admitting: Emergency Medicine

## 2021-06-22 DIAGNOSIS — S298XXA Other specified injuries of thorax, initial encounter: Secondary | ICD-10-CM

## 2021-06-22 MED ORDER — OXYCODONE-ACETAMINOPHEN 5-325 MG PO TABS: 1.0000 | ORAL_TABLET | ORAL | 0 refills | Status: DC | PRN

## 2021-06-22 MED ORDER — HYDROMORPHONE HCL 1 MG/ML IJ SOLN
2.0000 mg | Freq: Once | INTRAMUSCULAR | Status: AC
  Administered 2021-06-22: 2 mg via INTRAMUSCULAR
  Filled 2021-06-22: qty 2

## 2021-06-22 MED ORDER — ONDANSETRON 4 MG PO TBDP
8.0000 mg | ORAL_TABLET | Freq: Once | ORAL | Status: AC
  Administered 2021-06-22: 8 mg via ORAL
  Filled 2021-06-22: qty 2
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
## 2021-07-12 ENCOUNTER — Other Ambulatory Visit: Payer: Self-pay | Admitting: Registered Nurse

## 2021-07-12 DIAGNOSIS — N644 Mastodynia: Secondary | ICD-10-CM

## 2021-07-31 ENCOUNTER — Other Ambulatory Visit: Payer: No Typology Code available for payment source

## 2021-07-31 ENCOUNTER — Ambulatory Visit
Admission: RE | Admit: 2021-07-31 | Discharge: 2021-07-31 | Disposition: A | Discharge status: Home / Self Care | Payer: Self-pay | Source: Ambulatory Visit | Attending: Registered Nurse | Admitting: Registered Nurse

## 2021-07-31 ENCOUNTER — Other Ambulatory Visit: Payer: Self-pay

## 2021-07-31 DIAGNOSIS — N644 Mastodynia: Secondary | ICD-10-CM

## 2021-07-31 MED ORDER — GADOBUTROL 1 MMOL/ML IV SOLN
6.0000 mL | Freq: Once | INTRAVENOUS | Status: AC | PRN
  Administered 2021-07-31: 6 mL via INTRAVENOUS

## 2021-09-12 ENCOUNTER — Other Ambulatory Visit: Payer: Self-pay | Admitting: Registered Nurse

## 2021-11-23 ENCOUNTER — Other Ambulatory Visit: Payer: Self-pay | Admitting: Registered Nurse

## 2021-11-23 DIAGNOSIS — E7800 Pure hypercholesterolemia, unspecified: Secondary | ICD-10-CM

## 2021-12-22 ENCOUNTER — Ambulatory Visit
Admission: RE | Admit: 2021-12-22 | Discharge: 2021-12-22 | Disposition: A | Discharge status: Home / Self Care | Payer: No Typology Code available for payment source | Source: Ambulatory Visit | Attending: Registered Nurse | Admitting: Registered Nurse

## 2021-12-22 DIAGNOSIS — E7800 Pure hypercholesterolemia, unspecified: Secondary | ICD-10-CM

## 2022-05-18 ENCOUNTER — Other Ambulatory Visit: Payer: Self-pay | Admitting: Family Medicine

## 2022-05-18 DIAGNOSIS — Z1231 Encounter for screening mammogram for malignant neoplasm of breast: Secondary | ICD-10-CM

## 2022-06-04 ENCOUNTER — Ambulatory Visit
Admission: RE | Admit: 2022-06-04 | Discharge: 2022-06-04 | Disposition: A | Discharge status: Home / Self Care | Payer: Self-pay | Source: Ambulatory Visit | Attending: Family Medicine | Admitting: Family Medicine

## 2022-06-04 DIAGNOSIS — Z1231 Encounter for screening mammogram for malignant neoplasm of breast: Secondary | ICD-10-CM

## 2022-09-14 ENCOUNTER — Other Ambulatory Visit: Payer: Self-pay | Admitting: Family Medicine

## 2022-09-14 ENCOUNTER — Ambulatory Visit
Admission: RE | Admit: 2022-09-14 | Discharge: 2022-09-14 | Disposition: A | Discharge status: Home / Self Care | Payer: Managed Care, Other (non HMO) | Source: Ambulatory Visit | Attending: Family Medicine | Admitting: Family Medicine

## 2022-09-14 DIAGNOSIS — R059 Cough, unspecified: Secondary | ICD-10-CM

## 2022-12-05 ENCOUNTER — Other Ambulatory Visit (HOSPITAL_COMMUNITY): Payer: Self-pay

## 2022-12-05 MED ORDER — ZEPBOUND 5 MG/0.5ML ~~LOC~~ SOAJ
5.0000 mg | SUBCUTANEOUS | 0 refills | Status: DC
  Filled 2022-12-05: qty 2, 28d supply, fill #0

## 2022-12-06 ENCOUNTER — Other Ambulatory Visit (HOSPITAL_COMMUNITY): Payer: Self-pay

## 2022-12-26 ENCOUNTER — Other Ambulatory Visit (HOSPITAL_COMMUNITY): Payer: Self-pay

## 2022-12-27 ENCOUNTER — Other Ambulatory Visit (HOSPITAL_COMMUNITY): Payer: Self-pay

## 2022-12-27 MED ORDER — TIRZEPATIDE-WEIGHT MANAGEMENT 5 MG/0.5ML ~~LOC~~ SOAJ
5.0000 mg | SUBCUTANEOUS | 0 refills | Status: DC
  Filled 2022-12-27: qty 2, 28d supply, fill #0

## 2022-12-31 ENCOUNTER — Other Ambulatory Visit (HOSPITAL_COMMUNITY): Payer: Self-pay

## 2022-12-31 MED ORDER — TIRZEPATIDE-WEIGHT MANAGEMENT 5 MG/0.5ML ~~LOC~~ SOAJ
5.0000 mg | SUBCUTANEOUS | 2 refills | Status: AC
  Filled 2022-12-31: qty 2, 28d supply, fill #0

## 2023-01-15 ENCOUNTER — Other Ambulatory Visit (HOSPITAL_COMMUNITY): Payer: Self-pay

## 2023-01-15 MED ORDER — ZEPBOUND 5 MG/0.5ML ~~LOC~~ SOAJ
5.0000 mg | SUBCUTANEOUS | 2 refills | Status: DC
  Filled 2023-01-15: qty 2, 28d supply, fill #0

## 2023-01-31 ENCOUNTER — Other Ambulatory Visit (HOSPITAL_COMMUNITY): Payer: Self-pay

## 2023-02-15 ENCOUNTER — Telehealth: Payer: Self-pay | Admitting: *Deleted

## 2023-02-15 NOTE — Telephone Encounter (Signed)
Left detailed message on voicemail and informed patient to call back with her dme information for her upcoming appointment.

## 2023-02-25 ENCOUNTER — Ambulatory Visit: Payer: Managed Care, Other (non HMO) | Admitting: Cardiology

## 2023-04-29 ENCOUNTER — Other Ambulatory Visit: Payer: Self-pay | Admitting: Family Medicine

## 2023-04-29 DIAGNOSIS — Z1231 Encounter for screening mammogram for malignant neoplasm of breast: Secondary | ICD-10-CM

## 2023-05-31 ENCOUNTER — Telehealth: Payer: Self-pay | Admitting: *Deleted

## 2023-05-31 ENCOUNTER — Ambulatory Visit: Payer: Managed Care, Other (non HMO) | Attending: Cardiology | Admitting: Cardiology

## 2023-05-31 ENCOUNTER — Encounter: Payer: Self-pay | Admitting: Cardiology

## 2023-05-31 VITALS — BP 100/78 | HR 87 | Ht 62.5 in | Wt 116.2 lb

## 2023-05-31 DIAGNOSIS — G4733 Obstructive sleep apnea (adult) (pediatric): Secondary | ICD-10-CM

## 2023-05-31 DIAGNOSIS — I341 Nonrheumatic mitral (valve) prolapse: Secondary | ICD-10-CM | POA: Diagnosis not present

## 2023-05-31 NOTE — Addendum Note (Signed)
Addended by: Luellen Pucker on: 05/31/2023 01:54 PM   Modules accepted: Orders

## 2023-05-31 NOTE — Patient Instructions (Signed)
Medication Instructions:  Your physician recommends that you continue on your current medications as directed. Please refer to the Current Medication list given to you today.  *If you need a refill on your cardiac medications before your next appointment, please call your pharmacy*   Lab Work: None.  If you have labs (blood work) drawn today and your tests are completely normal, you will receive your results only by: MyChart Message (if you have MyChart) OR A paper copy in the mail If you have any lab test that is abnormal or we need to change your treatment, we will call you to review the results.   Testing/Procedures: Your physician has recommended that you have a home sleep study. This test records several body functions during sleep, including: brain activity, eye movement, oxygen and carbon dioxide blood levels, heart rate and rhythm, breathing rate and rhythm, the flow of air through your mouth and nose, snoring, body muscle movements, and chest and belly movement.  Your physician has requested that you have an echocardiogram. Echocardiography is a painless test that uses sound waves to create images of your heart. It provides your doctor with information about the size and shape of your heart and how well your heart's chambers and valves are working. This procedure takes approximately one hour. There are no restrictions for this procedure. Please do NOT wear cologne, perfume, aftershave, or lotions (deodorant is allowed). Please arrive 15 minutes prior to your appointment time.     Follow-Up: At [facility], you and your health needs are our priority.  As part of our continuing mission to provide you with exceptional heart care, we have created designated Provider Care Teams.  These Care Teams include your primary Cardiologist (physician) and Advanced Practice Providers (APPs -  Physician Assistants and Nurse Practitioners) who all work together to provide you with the care  you need, when you need it.  We recommend signing up for the patient portal called "MyChart".  Sign up information is provided on this After Visit Summary.  MyChart is used to connect with patients for Virtual Visits (Telemedicine).  Patients are able to view lab/test results, encounter notes, upcoming appointments, etc.  Non-urgent messages can be sent to your provider as well.   To learn more about what you can do with MyChart, go to [email or web address].    Your next appointment will be dependent  on the results of your testing and it will be with:    Provider:   Dr. Armanda Magic, MD

## 2023-05-31 NOTE — Progress Notes (Signed)
Sleep Medicine CONSULT Note    Date:  05/31/2023   ID:  Allison Guerrero, DOB Feb 04, 1956, MRN [id number]  PCP:  Lavada Mesi, MD  Cardiologist: None   No chief complaint on file.   History of Present Illness:  Allison Guerrero is a 67 y.o. female who is being seen today for the evaluation of OSA at the request of Allison Grippe, MD.  This is a 67 year old female with a history of mitral valve prolapse and obstructive sleep apnea on CPAP.  She had originally been treated for sleep apnea by Dr. Tresa Endo but was lost to follow-up and has not been seen by sleep medicine physician in over 5 years.  She is now referred for sleep medicine consultation.  She had a sleep study years ago that showed moderate obstructive sleep apnea with an AHI of 21.8/h and oxygen nadir at 78%.  When he saw her she had an Epworth sleepiness score of 17.She was ultimately placed on CPAP.  Of note she had a 2D echo in 2018 that showed mild MVP of the anterior mitral valve leaflet with no MR.  She was lost to followup and is now referred for sleep medicine consultation.    She tells me that she had been back and for working in [place] and could not keep track of how she was doing on it and eventually stopped using her CPAP.  She goes to bed at 10pm and wakes up at 1-2am and then again at 3am and cannot stay asleep.  Around 5am she will go into a deeper sleep.  She sleeps late to try to get her sleep in.   She says that she does not really have any problems during the day with sleepiness although she admits to napping some during the day that she thinks is out of boredom.. She says that her husband says that she snores some but she does not wake herself up snoring.   She decided to get her CPAP out and start to use it. This am she went to get her device to bring it with her and she could not find her device.  She wanted to get new tubing and supplies and needed an OV before she could get new supplies.  She says that her device  is 68 years old.     Past Medical History:  Diagnosis Date   Arthritis    Blood transfusion    1983 r/t postpartum hemorrhage   Cancer ([facility])    cervical 20 yrs ago   Chronic kidney disease    kidney stones   Depression    Headache(784.0)    migraines   Heart murmur    History of kidney stones    resolved with lithotripsy   Hyperlipidemia    Mitral valve prolapse    very minor; no surgical intervention needed at this time    Past Surgical History:  Procedure Laterality Date   ANTERIOR CERVICAL DECOMP/DISCECTOMY FUSION N/A 04/29/2013   Procedure: ANTERIOR CERVICAL DECOMPRESSION/DISCECTOMY FUSION 3 LEVELS;  Surgeon: Emilee Hero, MD;  Location: [facility];  Service: Orthopedics;  Laterality: N/A;  Anterior cervical decompression fusion, cervical 4-5, cervical 5-6, cervical 6-7 wtih instrumentation and allograft   BLEPHAROPLASTY Bilateral 40981   BREAST BIOPSY Right 01/14/2020   BREAST SURGERY Bilateral 08/20/1984   implants   BREAST SURGERY Bilateral 08/20/1992   ruptured and replaced   BREAST SURGERY Bilateral 08/21/2011   lift and replaced with smaller breast  EYE SURGERY     LITHOTRIPSY  08/21/2011   TONSILLECTOMY      Current Medications: Current Meds  Medication Sig   Furosemide (LASIX PO) Take 1 tablet by mouth daily.   LORazepam (ATIVAN) 2 MG tablet Take 2 mg by mouth at bedtime.     SUMAtriptan (IMITREX) 100 MG tablet Take 100 mg by mouth as needed.    tirzepatide (ZEPBOUND) 5 MG/0.5ML Pen Inject 5 mg into the skin once a week.   tirzepatide (ZEPBOUND) 5 MG/0.5ML Pen Inject 5 mg into the skin once a week.   [DISCONTINUED] oxyCODONE-acetaminophen (PERCOCET) 5-325 MG tablet Take 1 tablet by mouth every 4 (four) hours as needed for severe pain.   [DISCONTINUED] pramipexole (MIRAPEX) 1 MG tablet Take 1 mg by mouth daily.   [DISCONTINUED] pravastatin (PRAVACHOL) 80 MG tablet Take 80 mg by mouth daily.    Allergies:   Patient has no known allergies.   Social  History   Socioeconomic History   Marital status: Married    Spouse name: Not on file   Number of children: Not on file   Years of education: Not on file   Highest education level: Not on file  Occupational History   Not on file  Tobacco Use   Smoking status: Former    Current packs/day: 0.00    Average packs/day: 1 pack/day for 20.0 years (20.0 ttl pk-yrs)    Types: Cigarettes    Start date: 04/29/1983    Quit date: 04/29/2003    Years since quitting: 20.1   Smokeless tobacco: Never  Substance and Sexual Activity   Alcohol use: No   Drug use: No   Sexual activity: Not on file  Other Topics Concern   Not on file  Social History Narrative   Not on file   Social Determinants of Health   Financial Resource Strain: Not on file  Food Insecurity: Not on file  Transportation Needs: Not on file  Physical Activity: Not on file  Stress: Not on file  Social Connections: Unknown (01/02/2022)   Received from [facility]   Social Network    Social Network: Not on file     Family History:  The patient's family history includes Cervical cancer in her sister; Depression in her mother; Heart failure in her mother; Lung cancer in her father.   ROS:   Please see the history of present illness.    ROS All other systems reviewed and are negative.     05/09/2017    9:54 PM  PAD Screen  Previous PAD dx? No  Previous surgical procedure? No  Pain with walking? No  Feet/toe relief with dangling? No  Painful, non-healing ulcers? No  Extremities discolored? No       PHYSICAL EXAM:   VS:  BP 100/78   Pulse 87   Ht 5' 2.5" (1.588 m)   Wt 116 lb 3.2 oz (52.7 kg)   SpO2 97%   BMI 20.91 kg/m    GEN: Well nourished, well developed, in no acute distress  HEENT: normal  Neck: no JVD, carotid bruits, or masses Cardiac: RRR; no murmurs, rubs, or gallops,no edema.  Intact distal pulses bilaterally.  Respiratory:  clear to auscultation bilaterally, normal work of  breathing GI: soft, nontender, nondistended, + BS MS: no deformity or atrophy  Skin: warm and dry, no rash Neuro:  Alert and Oriented x 3, Strength and sensation are intact Psych: euthymic mood, full affect  Wt Readings from Last 3  Encounters:  05/31/23 116 lb 3.2 oz (52.7 kg)  07/08/17 134 lb (60.8 kg)  05/07/17 140 lb (63.5 kg)      Studies/Labs Reviewed:   Home sleep study  Recent Labs: No results found for requested labs within last 365 days.     ASSESSMENT:    1. OSA (obstructive sleep apnea)   2. Mitral valve prolapse      PLAN:  In order of problems listed above:  #OSA  -her CPAP device is over 77 years old and now she cannot find hers -since her last sleep study as in 2018 we will update her sleep study with an [place] HST and then get a new device with new mask and supplies  #MVP -Noted on echo 2018 with no MR -Will repeat 2D echo to make sure this is stable and that she has not developed MR   Time Spent: 20 minutes total time of encounter, including 15 minutes spent in face-to-face patient care on the date of this encounter. This time includes coordination of care and counseling regarding above mentioned problem list. Remainder of non-face-to-face time involved reviewing chart documents/testing relevant to the patient encounter and documentation in the medical record. I have independently reviewed documentation from referring provider  Medication Adjustments/Labs and Tests Ordered: Current medicines are reviewed at length with the patient today.  Concerns regarding medicines are outlined above.  Medication changes, Labs and Tests ordered today are listed in the Patient Instructions below.  There are no Patient Instructions on file for this visit.   Signed, Armanda Magic, MD  05/31/2023 1:50 PM    [facility] [address] [place], [place], [place]  [address] Phone: [phone]; Fax: [phone]

## 2023-05-31 NOTE — Telephone Encounter (Signed)
Patient agreement reviewed and signed on 05/31/2023.  WatchPAT issued to patient on 05/31/2023 by Danielle Rankin, CMA. Patient aware to not open the WatchPAT box until contacted with the activation PIN. Patient profile initialized in CloudPAT on 05/31/2023 by KATY STEIGELMAN, STRESS TEST TECH. Device serial number: [id number]  Please list Reason for Call as Advice Only and type "WatchPAT issued to patient" in the comment box.

## 2023-06-07 ENCOUNTER — Ambulatory Visit
Admission: RE | Admit: 2023-06-07 | Discharge: 2023-06-07 | Disposition: A | Discharge status: Home / Self Care | Payer: Managed Care, Other (non HMO) | Source: Ambulatory Visit | Attending: Family Medicine | Admitting: Family Medicine

## 2023-06-07 DIAGNOSIS — Z1231 Encounter for screening mammogram for malignant neoplasm of breast: Secondary | ICD-10-CM

## 2023-06-18 NOTE — Telephone Encounter (Signed)
**Note De-Identified Jhace Fennell Obfuscation** Ordering provider: Dr Mayford Knife Associated diagnoses: OSA-G47.83 WatchPAT PA obtained on 06/18/2023 by Khayden Herzberg, Lorelle Formosa, LPN. Reference #: Mel P. 06/08/2023 1:49pm No PA Required per Mel P at [facility] Patient notified of PIN ([id number]) on 06/18/2023 Senna Lape Notification Method: phone. The pt is currently in [place] and will not return to the states until 06/27/23.   She states that she plans to do her HST after that date when she gets over "Jet lag".  Phone note routed to covering staff for follow-up.

## 2023-07-01 ENCOUNTER — Ambulatory Visit (HOSPITAL_COMMUNITY): Payer: Managed Care, Other (non HMO) | Attending: Cardiology

## 2023-07-01 DIAGNOSIS — I351 Nonrheumatic aortic (valve) insufficiency: Secondary | ICD-10-CM

## 2023-07-01 DIAGNOSIS — I341 Nonrheumatic mitral (valve) prolapse: Secondary | ICD-10-CM | POA: Insufficient documentation

## 2023-07-02 ENCOUNTER — Encounter: Payer: Self-pay | Admitting: Cardiology

## 2023-07-02 DIAGNOSIS — I351 Nonrheumatic aortic (valve) insufficiency: Secondary | ICD-10-CM | POA: Insufficient documentation

## 2023-07-02 LAB — ECHOCARDIOGRAM COMPLETE
Area-P 1/2: 4.21 cm2
S' Lateral: 2 cm

## 2023-07-03 ENCOUNTER — Telehealth: Payer: Self-pay

## 2023-07-03 NOTE — Telephone Encounter (Signed)
Reviewed with patient echo showed normal pumping function of the heart with increased diffuse in the heart called diastolic dysfunction normal for age. Patient verbalizes understanding there is mild leakiness of the aortic valve.

## 2023-07-03 NOTE — Telephone Encounter (Signed)
-----   Message from Armanda Magic sent at 07/02/2023  5:01 PM EST ----- Echo showed normal pumping function of the heart with increased diffuse in the heart called diastolic dysfunction normal for age.  There is mild leakiness of the aortic valve.

## 2023-07-08 ENCOUNTER — Telehealth: Payer: Self-pay | Admitting: Cardiology

## 2023-07-08 ENCOUNTER — Encounter (INDEPENDENT_AMBULATORY_CARE_PROVIDER_SITE_OTHER): Payer: Self-pay | Admitting: Cardiology

## 2023-07-08 DIAGNOSIS — G4733 Obstructive sleep apnea (adult) (pediatric): Secondary | ICD-10-CM

## 2023-07-08 NOTE — Telephone Encounter (Signed)
Pt is calling back to see if she was suppose to get a passcode to do a sleep study

## 2023-07-08 NOTE — Telephone Encounter (Signed)
I s/w the pt and she has been given PIN# [id number]. Pt thanked me for the help. Pt states she will do sleep study once night this week.

## 2023-07-09 ENCOUNTER — Ambulatory Visit: Payer: Managed Care, Other (non HMO) | Attending: Cardiology

## 2023-07-09 DIAGNOSIS — G4733 Obstructive sleep apnea (adult) (pediatric): Secondary | ICD-10-CM

## 2023-07-09 NOTE — Procedures (Signed)
SLEEP STUDY REPORT Patient Information Study Date: 07/08/2023 Patient Name: Allison Guerrero Patient ID: [id number] Birth Date: [birth year] Age: 67 Gender: Female BMI: 20.7 (W=117 lb, H=5' 3'') Stopbang: 4 Referring Physician: Armanda Magic, MD  TEST DESCRIPTION: Home sleep apnea testing was completed using the WatchPat, a Type 1 device, utilizing  peripheral arterial tonometry (PAT), chest movement, actigraphy, pulse oximetry, pulse rate, body position and snore.  AHI was calculated with apnea and hypopnea using valid sleep time as the denominator. RDI includes apneas,  hypopneas, and RERAs. The data acquired and the scoring of sleep and all associated events were performed in  accordance with the recommended standards and specifications as outlined in the AASM Manual for the Scoring of  Sleep and Associated Events 2.2.0 (2015).   FINDINGS:   1. Mild Obstructive Sleep Apnea with AHI 9.4/hr overall and moderate during REM sleep with REM AHI 17.8/hr..   2. No Central Sleep Apnea with pAHIc 0.7/hr.   3. Oxygen desaturations as low as 80%.   4. Mild to moderate snoring was present. O2 sats were < 88% for 4.9 min.   5. Total sleep time was 7 hrs and 15 min.   6. 21.5% of total sleep time was spent in REM sleep.   7. Normal sleep onset latency at 25 min.   8. Shortened REM sleep onset latency at 55 min.   9. Total awakenings were 5.  10. Arrhythmia detection: None  DIAGNOSIS: Mild Obstructive Sleep Apnea (G47.33)   RECOMMENDATIONS: 1. Clinical correlation of these findings is necessary. The decision to treat obstructive sleep apnea (OSA) is usually  based on the presence of apnea symptoms or the presence of associated medical conditions such as Hypertension,  Congestive Heart Failure, Atrial Fibrillation or Obesity. The most common symptoms of OSA are snoring, gasping for  breath while sleeping, daytime sleepiness and fatigue.   2. Initiating apnea therapy is recommended given  the presence of symptoms and/or associated conditions.  Recommend proceeding with one of the following:   a. Auto-CPAP therapy with a pressure range of 5-20cm H2O.   b. An oral appliance (OA) that can be obtained from certain dentists with expertise in sleep medicine. These are  primarily of use in non-obese patients with mild and moderate disease.   c. An ENT consultation which may be useful to look for specific causes of obstruction and possible treatment  options.   d. If patient is intolerant to PAP therapy, consider referral to ENT for evaluation for hypoglossal nerve stimulator.   3. Close follow-up is necessary to ensure success with CPAP or oral appliance therapy for maximum benefit .  4. A follow-up oximetry study on CPAP is recommended to assess the adequacy of therapy and determine the need  for supplemental oxygen or the potential need for Bi-level therapy. An arterial blood gas to determine the adequacy of  baseline ventilation and oxygenation should also be considered.  5. Healthy sleep recommendations include: adequate nightly sleep (normal 7-9 hrs/night), avoidance of caffeine after  noon and alcohol near bedtime, and maintaining a sleep environment that is cool, dark and quiet.  6. Weight loss for overweight patients is recommended. Even modest amounts of weight loss can significantly  improve the severity of sleep apnea.  7. Snoring recommendations include: weight loss where appropriate, side sleeping, and avoidance of alcohol before  bed.  8. Operation of motor vehicle should be avoided when sleepy.  Signature: Armanda Magic, MD; [facility]; Diplomat, American Board of Sleep  Medicine Electronically Signed: 07/09/2023 10:58:53 AM

## 2023-07-15 ENCOUNTER — Telehealth: Payer: Self-pay | Admitting: *Deleted

## 2023-07-15 DIAGNOSIS — G4733 Obstructive sleep apnea (adult) (pediatric): Secondary | ICD-10-CM

## 2023-07-15 NOTE — Telephone Encounter (Signed)
-----   Message from Armanda Magic sent at 07/09/2023 11:01 AM EST ----- Please let patient know that they have sleep apnea and recommend treating with CPAP.  Please order an auto CPAP from 4-15cm H2O with heated humidity and mask of choice.  Order overnight pulse ox on CPAP.  Followup with me in 6 weeks.

## 2023-07-15 NOTE — Telephone Encounter (Signed)
The patient has been notified of the result and verbalized understanding.  All questions (if any) were answered. Allison Guerrero, CMA 07/15/2023 4:05 PM    Upon patient request DME selection is [facility] Patient understands he will be contacted by [facility] to set up his cpap. Patient understands to call if [facility] does not contact him with new setup in a timely manner. Patient understands they will be called once confirmation has been received from [facility] that they have received their new machine to schedule 10 week follow up appointment.   [facility] notified of new cpap order  Please add to airview Patient was grateful for the call and thanked me.

## 2023-08-02 ENCOUNTER — Telehealth: Payer: Self-pay | Admitting: Cardiology

## 2023-08-02 NOTE — Addendum Note (Signed)
Addended by: Brunetta Genera on: 08/02/2023 01:43 PM   Modules accepted: Orders

## 2023-08-02 NOTE — Telephone Encounter (Signed)
Patient stated an order needs to be sent in to her insurance company [facility]) for a replacement CPAP machine.  Patient stated she would appreciate getting the order placed before the end of the year.

## 2023-08-02 NOTE — Telephone Encounter (Signed)
Patient stated [employer] told her they will only pay for a new machine after 7 months of compliance. Per [facility], her order needs to say "Replacement" machine for insurance coverage. Order updated in Epic today and resubmitted to [facility].

## 2023-10-08 ENCOUNTER — Telehealth: Payer: Self-pay | Admitting: Cardiology

## 2023-10-08 NOTE — Telephone Encounter (Signed)
 I contacted pt to r/s appt due to inclement weather, I offered the next available which was 03/24 but patient states that she will be in [place] then, she leaves on 03/18. She says that she is having issues with her CPAP and would like to be seen before then but I do not see anything open. Are we able to overbook or double book?

## 2023-10-09 ENCOUNTER — Ambulatory Visit: Payer: Managed Care, Other (non HMO) | Admitting: Cardiology

## 2023-10-09 NOTE — Telephone Encounter (Signed)
 Spoke with the patient who states that she is just having trouble sleeping with her CPAP. She is currently using a pillow mask. She states that she is constantly waking up during the night due to being uncomfortable. She is often checking during the night to see if she has reached her 4 hours of compliance so that she can take it off. She states that she was having trouble sleeping prior to the CPAP machine but has now worsened.

## 2023-10-29 ENCOUNTER — Encounter: Payer: Self-pay | Admitting: Cardiology

## 2023-10-29 ENCOUNTER — Ambulatory Visit: Attending: Cardiology | Admitting: Cardiology

## 2023-10-29 VITALS — BP 120/84 | HR 78 | Ht 62.54 in | Wt 116.8 lb

## 2023-10-29 DIAGNOSIS — I341 Nonrheumatic mitral (valve) prolapse: Secondary | ICD-10-CM | POA: Diagnosis not present

## 2023-10-29 DIAGNOSIS — I351 Nonrheumatic aortic (valve) insufficiency: Secondary | ICD-10-CM

## 2023-10-29 DIAGNOSIS — G4733 Obstructive sleep apnea (adult) (pediatric): Secondary | ICD-10-CM | POA: Diagnosis not present

## 2023-10-29 NOTE — Patient Instructions (Signed)
 Medication Instructions:  Your physician recommends that you continue on your current medications as directed. Please refer to the Current Medication list given to you today.  *If you need a refill on your cardiac medications before your next appointment, please call your pharmacy*   Lab Work: NONE  If you have labs (blood work) drawn today and your tests are completely normal, you will receive your results only by: MyChart Message (if you have MyChart) OR A paper copy in the mail If you have any lab test that is abnormal or we need to change your treatment, we will call you to review the results.   Testing/Procedures: Your physician has referred you to Irene Limbo, DDS for oral device for Sleep Apnea.   Follow-Up: At [facility], you and your health needs are our priority.  As part of our continuing mission to provide you with exceptional heart care, we have created designated Provider Care Teams.  These Care Teams include your primary Cardiologist (physician) and Advanced Practice Providers (APPs -  Physician Assistants and Nurse Practitioners) who all work together to provide you with the care you need, when you need it.  We recommend signing up for the patient portal called "MyChart".  Sign up information is provided on this After Visit Summary.  MyChart is used to connect with patients for Virtual Visits (Telemedicine).  Patients are able to view lab/test results, encounter notes, upcoming appointments, etc.  Non-urgent messages can be sent to your provider as well.   To learn more about what you can do with MyChart, go to [email or web address].    Your next appointment:   6 month(s)  Provider:   Armanda Magic, MD

## 2023-10-29 NOTE — Progress Notes (Signed)
 Sleep Medicine Note    Date:  10/29/2023   ID:  Allison Guerrero, DOB 02/05/1956, MRN [id number]  PCP:  Lavada Mesi, MD  Cardiologist: None   Chief Complaint  Patient presents with   Sleep Apnea   Mitral Regurgitation   Aortic Insuffiency    History of Present Illness:  Allison Guerrero is a 68 y.o. female  with a history of mitral valve prolapse and obstructive sleep apnea on CPAP.  She had originally been treated for sleep apnea by Dr. Tresa Endo but was lost to follow-up and has not been seen by sleep medicine physician in over 5 years.  She is now referred for sleep medicine consultation.  She had a sleep study years ago that showed moderate obstructive sleep apnea with an AHI of 21.8/h and oxygen nadir at 78%.  When he saw her she had an Epworth sleepiness score of 17.She was ultimately placed on CPAP.  Of note she had a 2D echo in 2018 that showed mild MVP of the anterior mitral valve leaflet with no MR.  She was lost to followup and is now referred for sleep medicine consultation.    At the last office visit she had been working in [place] and could not keep track of how she was doing on her device and eventually stopped using her CPAP. She told me that she did not really have any problems during the day with sleepiness although she admitted to napping some during the day that she thinks is out of boredom.. She said that her husband said that she snores some but she does not wake herself up snoring.   She decided to get her CPAP out and start to using it again right before her last office visit with me but she could not find her device device. She had another HST done showing mild OSA with an AHI of 9.4/hr and moderate during REM sleep at 17.8./hr.  Since her device was over 11 years old so we ordered her new device and she is now back for follow-up after getting her new CPAP device per insurance requirements..   She got her ne\w device but hates it. She tried the nasal pillow and  nasal mask but did not like either of them.  She has trouble sleeping to start with and just cannot stand anything on her face.  She is constantly waking up checking how many hours she has left to use it to meet compliance and then pulls it off.  She has never slept well at night and has to take lorazepam to sleep.   Past Medical History:  Diagnosis Date   Aortic insufficiency    Mild by echo 06/2023   Arthritis    Blood transfusion    1983 r/t postpartum hemorrhage   Cancer (HCC)    cervical 20 yrs ago   Chronic kidney disease    kidney stones   Depression    Headache(784.0)    migraines   Heart murmur    History of kidney stones    resolved with lithotripsy   Hyperlipidemia    Mitral valve prolapse    very minor; no surgical intervention needed at this time    Past Surgical History:  Procedure Laterality Date   ANTERIOR CERVICAL DECOMP/DISCECTOMY FUSION N/A 04/29/2013   Procedure: ANTERIOR CERVICAL DECOMPRESSION/DISCECTOMY FUSION 3 LEVELS;  Surgeon: Emilee Hero, MD;  Location: [facility] OR;  Service: Orthopedics;  Laterality: N/A;  Anterior cervical decompression fusion, cervical 4-5,  cervical 5-6, cervical 6-7 wtih instrumentation and allograft   BLEPHAROPLASTY Bilateral 21308   BREAST BIOPSY Right 01/14/2020   BREAST SURGERY Bilateral 08/20/1984   implants   BREAST SURGERY Bilateral 08/20/1992   ruptured and replaced   BREAST SURGERY Bilateral 08/21/2011   lift and replaced with smaller breast   EYE SURGERY     LITHOTRIPSY  08/21/2011   TONSILLECTOMY      Current Medications: Current Meds  Medication Sig   eszopiclone (LUNESTA) 2 MG TABS tablet Take 2 mg by mouth at bedtime as needed.   LORazepam (ATIVAN) 2 MG tablet Take 2 mg by mouth at bedtime.     SUMAtriptan (IMITREX) 100 MG tablet Take 100 mg by mouth as needed.    tirzepatide (ZEPBOUND) 5 MG/0.5ML Pen Inject 5 mg into the skin once a week.   YUVAFEM 10 MCG TABS vaginal tablet Place 1 tablet vaginally 2  (two) times a week.    Allergies:   Patient has no known allergies.   Social History   Socioeconomic History   Marital status: Married    Spouse name: Not on file   Number of children: Not on file   Years of education: Not on file   Highest education level: Not on file  Occupational History   Not on file  Tobacco Use   Smoking status: Former    Current packs/day: 0.00    Average packs/day: 1 pack/day for 20.0 years (20.0 ttl pk-yrs)    Types: Cigarettes    Start date: 04/29/1983    Quit date: 04/29/2003    Years since quitting: 20.5   Smokeless tobacco: Never  Substance and Sexual Activity   Alcohol use: No   Drug use: No   Sexual activity: Not on file  Other Topics Concern   Not on file  Social History Narrative   Not on file   Social Drivers of Health   Financial Resource Strain: Not on file  Food Insecurity: Not on file  Transportation Needs: Not on file  Physical Activity: Not on file  Stress: Not on file  Social Connections: Unknown (01/02/2022)   Received [facility]   Social Network    Social Network: Not on file     Family History:  The patient's family history includes Breast cancer (age of onset: 42) in her sister; Cervical cancer in her sister; Depression in her mother; Heart failure in her mother; Lung cancer in her father.   ROS:   Please see the history of present illness.    ROS All other systems reviewed and are negative.     05/09/2017    9:54 PM  PAD Screen  Previous PAD dx? No  Previous surgical procedure? No  Pain with walking? No  Feet/toe relief with dangling? No  Painful, non-healing ulcers? No  Extremities discolored? No       PHYSICAL EXAM:   VS:  BP 120/84   Pulse 78   Ht 5' 2.54" (1.589 m)   Wt 116 lb 12.8 oz (53 kg)   SpO2 94%   BMI 21.00 kg/m    GEN: Well nourished, well developed in no acute distress HEENT: Normal NECK: No JVD; No carotid bruits LYMPHATICS: No lymphadenopathy CARDIAC:RRR, no  murmurs, rubs, gallops RESPIRATORY:  Clear to auscultation without rales, wheezing or rhonchi  ABDOMEN: Soft, non-tender, non-distended MUSCULOSKELETAL:  No edema; No deformity  SKIN: Warm and dry NEUROLOGIC:  Alert and oriented x 3 PSYCHIATRIC:  Normal affect  Wt  Readings from Last 3 Encounters:  10/29/23 116 lb 12.8 oz (53 kg)  05/31/23 116 lb 3.2 oz (52.7 kg)  07/08/17 134 lb (60.8 kg)      Studies/Labs Reviewed:   Home sleep study  Recent Labs: No results found for requested labs within last 365 days.     ASSESSMENT:    1. OSA (obstructive sleep apnea)   2. Mitral valve prolapse   3. Nonrheumatic aortic valve insufficiency       PLAN:  In order of problems listed above:  OSA - The patient is tolerating PAP therapy well without any problems. The PAP download performed by his DME was personally reviewed and interpreted by me today and showed an AHI of 1 /hr on auto CPAP from 4-15 cm H2O with 60% compliance in using more than 4 hours nightly.  The patient has been using and benefiting from PAP use and will continue to benefit from therapy.  -I encouraged her to be more compliant with her device -we discussed other options including an oral appliance vs.doing nothing -she has daytime sleepiness but cannot sleep at night and has to take lorazepam to help her sleep and suspect that she does not get enough sleep.  She goes to bed at 10pm and wakes up between 2-3am and then gets back to sleep an hour later and gets her best sleep from 4am to 7:30am to 8am.  Lately she sleeps so badly that she gets up around 10am. -I have strongly recommended an oral appliance instead of not treating her OSA>>will refer to Irene Limbo, DDS for oral device -encouraged her to try to talk with her PCP about her insomnia  MVP -Noted on echo 2018 with no MR -2D echo 07/10/2023 showed EF 60 to 65% with G1 DD and normal mitral valve  Aortic insufficiency -Mild by 2D echo 07/10/2023 -Repeat echo  06/2025  Followup with me in 6 months   Time Spent: 20 minutes total time of encounter, including 15 minutes spent in face-to-face patient care on the date of this encounter. This time includes coordination of care and counseling regarding above mentioned problem list. Remainder of non-face-to-face time involved reviewing chart documents/testing relevant to the patient encounter and documentation in the medical record. I have independently reviewed documentation from referring provider  Medication Adjustments/Labs and Tests Ordered: Current medicines are reviewed at length with the patient today.  Concerns regarding medicines are outlined above.  Medication changes, Labs and Tests ordered today are listed in the Patient Instructions below.  There are no Patient Instructions on file for this visit.   Signed, Armanda Magic, MD  10/29/2023 3:34 PM    [facility] [address] [place], [place], [place]  [address] Phone: [phone]; Fax: [phone]

## 2023-10-29 NOTE — Addendum Note (Signed)
 Addended by: Macie Burows on: 10/29/2023 03:50 PM   Modules accepted: Orders

## 2024-01-29 ENCOUNTER — Other Ambulatory Visit: Payer: Self-pay | Admitting: Internal Medicine

## 2024-01-29 DIAGNOSIS — E7800 Pure hypercholesterolemia, unspecified: Secondary | ICD-10-CM

## 2024-02-04 ENCOUNTER — Other Ambulatory Visit

## 2024-02-04 ENCOUNTER — Ambulatory Visit
Admission: RE | Admit: 2024-02-04 | Discharge: 2024-02-04 | Disposition: A | Discharge status: Home / Self Care | Source: Ambulatory Visit | Attending: Internal Medicine | Admitting: Internal Medicine

## 2024-02-04 DIAGNOSIS — E7800 Pure hypercholesterolemia, unspecified: Secondary | ICD-10-CM

## 2024-02-20 ENCOUNTER — Ambulatory Visit: Admitting: Radiology

## 2024-02-20 ENCOUNTER — Encounter: Payer: Self-pay | Admitting: Radiology

## 2024-02-20 VITALS — BP 110/58 | HR 79 | Ht 63.39 in | Wt 113.2 lb

## 2024-02-20 DIAGNOSIS — N951 Menopausal and female climacteric states: Secondary | ICD-10-CM

## 2024-02-20 DIAGNOSIS — E2839 Other primary ovarian failure: Secondary | ICD-10-CM

## 2024-02-20 DIAGNOSIS — Z79890 Hormone replacement therapy (postmenopausal): Secondary | ICD-10-CM | POA: Diagnosis not present

## 2024-02-20 MED ORDER — ESTRADIOL 10 MCG VA TABS: 1.0000 | ORAL_TABLET | VAGINAL | 1 refills | Status: DC

## 2024-02-20 MED ORDER — ESTRADIOL 0.05 MG/24HR TD PTTW: 1.0000 | MEDICATED_PATCH | TRANSDERMAL | 1 refills | Status: DC

## 2024-02-20 MED ORDER — PROGESTERONE MICRONIZED 100 MG PO CAPS: 100.0000 mg | ORAL_CAPSULE | Freq: Every day | ORAL | 1 refills | Status: DC

## 2024-02-20 NOTE — Progress Notes (Signed)
   Allison Guerrero 1956/05/11 [id number]   History: Postmenopausal 68 y.o. presents for HRT consult, referred from [facility]. Was on prempro for over 15 years, was advised to stop when her sister was diagnosed with breast cancer. Genetic testing negative. Normal mammo in October. Has trouble sleeping, recently came off ativan  for sleep. Hot flashes at night, brain fog, exhaustion during the day. Would like to go back on hormones for the cognitive benefits and sleep. Also requesting DEXA, has never had one, cardiac CT showed osteopenia. AEX/pap with PCP last month.    Gynecologic History Postmenopausal Last Pap: 5/25. Results were: normal Last mammogram: 10/24. Results were: normal Last colonoscopy: 2023 HRT use: stopped prempro 2018  Obstetric History OB History  No obstetric history on file.      The following portions of the patient's history were reviewed and updated as appropriate: allergies, current medications, past family history, past medical history, past social history, past surgical history, and problem list.  Review of Systems Pertinent items noted in HPI and remainder of comprehensive ROS otherwise negative.  Past medical history, past surgical history, family history and social history were all reviewed and documented in the EPIC chart.  Exam:  Vitals:   02/20/24 1412  BP: (!) 110/58  Pulse: 79  SpO2: 96%  Weight: 113 lb 3.2 oz (51.3 kg)  Height: 5' 3.39 (1.61 m)   Body mass index is 19.81 kg/m.  Physical Exam Constitutional:      Appearance: Normal appearance. She is normal weight.  Pulmonary:     Effort: Pulmonary effort is normal.  Neurological:     Mental Status: She is alert.  Psychiatric:        Mood and Affect: Mood normal.        Thought Content: Thought content normal.        Judgment: Judgment normal.      Assessment/Plan:   1. Menopausal syndrome on hormone replacement therapy (Primary) Discussed risks and benefits in detail. Would like to  restart HRT.  - progesterone (PROMETRIUM) 100 MG capsule; Take 1 capsule (100 mg total) by mouth daily.  Dispense: 90 capsule; Refill: 1 - estradiol (VIVELLE-DOT) 0.05 MG/24HR patch; Place 1 patch (0.05 mg total) onto the skin 2 (two) times a week.  Dispense: 24 patch; Refill: 1 - Estradiol 10 MCG TABS vaginal tablet; Place 1 tablet (10 mcg total) vaginally 2 (two) times a week.  Dispense: 24 tablet; Refill: 1  2. Estrogen deficiency - DG Bone Density; Future   Follow up in 3months  Allison Guerrero B WHNP-BC, 3:05 PM 02/20/2024

## 2024-04-16 LAB — HM DEXA SCAN

## 2024-04-22 ENCOUNTER — Ambulatory Visit: Payer: Self-pay | Admitting: Radiology

## 2024-05-01 ENCOUNTER — Encounter: Payer: Self-pay | Admitting: Cardiology

## 2024-05-18 ENCOUNTER — Other Ambulatory Visit: Payer: Self-pay | Admitting: Internal Medicine

## 2024-05-18 DIAGNOSIS — Z1231 Encounter for screening mammogram for malignant neoplasm of breast: Secondary | ICD-10-CM

## 2024-05-22 ENCOUNTER — Ambulatory Visit: Admitting: Radiology

## 2024-05-22 ENCOUNTER — Encounter: Payer: Self-pay | Admitting: Radiology

## 2024-05-22 VITALS — BP 116/78 | Wt 116.0 lb

## 2024-05-22 DIAGNOSIS — Z79890 Hormone replacement therapy (postmenopausal): Secondary | ICD-10-CM

## 2024-05-22 DIAGNOSIS — N951 Menopausal and female climacteric states: Secondary | ICD-10-CM | POA: Diagnosis not present

## 2024-05-22 DIAGNOSIS — M8580 Other specified disorders of bone density and structure, unspecified site: Secondary | ICD-10-CM | POA: Diagnosis not present

## 2024-05-22 MED ORDER — ESTRADIOL 0.075 MG/24HR TD PTTW
1.0000 | MEDICATED_PATCH | TRANSDERMAL | 2 refills | Status: AC
Start: 2024-05-25 — End: ?

## 2024-05-22 MED ORDER — PROGESTERONE MICRONIZED 100 MG PO CAPS: 200.0000 mg | ORAL_CAPSULE | Freq: Every evening | ORAL | 2 refills | Status: AC

## 2024-05-22 NOTE — Progress Notes (Signed)
   Allison Guerrero 1956/07/18 [id number]   History: Postmenopausal 68 y.o. presents for follow up after restarting HRT. Doing better. Diagnosed with osteopenia, would like to increase estrogen patch and progesterone .  She [facility] from [facility]. Was on prempro for over 15 years, was advised to stop when her sister was diagnosed with breast cancer. Genetic testing negative. Normal mammo in October 2024. Has trouble sleeping, recently came off ativan  for sleep. Hot flashes at night, brain fog, exhaustion during the day. Would like to go back on hormones for the cognitive benefits and sleep.   Gynecologic History Postmenopausal Last Pap: 5/25. Results were: normal Last mammogram: 10/24. Results were: normal Last colonoscopy: 2023 HRT use: stopped prempro 2018   The following portions of the patient's history were reviewed and updated as appropriate: allergies, current medications, past family history, past medical history, past social history, past surgical history, and problem list.  Review of Systems Pertinent items noted in HPI and remainder of comprehensive ROS otherwise negative.  Past medical history, past surgical history, family history and social history were all reviewed and documented in the EPIC chart.  Exam:  There were no vitals filed for this visit.  There is no height or weight on file to calculate BMI.  Physical Exam Constitutional:      Appearance: Normal appearance. She is normal weight.  Pulmonary:     Effort: Pulmonary effort is normal.  Neurological:     Mental Status: She is alert.  Psychiatric:        Mood and Affect: Mood normal.        Thought Content: Thought content normal.        Judgment: Judgment normal.      Assessment/Plan:   1. Menopausal syndrome on hormone replacement therapy - progesterone  (PROMETRIUM ) 100 MG capsule; Take 2 capsules (200 mg total) by mouth at bedtime.  Dispense: 180 capsule; Refill: 2 - estradiol  (VIVELLE -DOT) 0.075  MG/24HR; Place 1 patch onto the skin 2 (two) times a week.  Dispense: 24 patch; Refill: 2  2. Osteopenia after menopause (Primary) - estradiol  (VIVELLE -DOT) 0.075 MG/24HR; Place 1 patch onto the skin 2 (two) times a week.  Dispense: 24 patch; Refill: 2    Aware of risks and benefits. Follow up in 9months  Taniaya Rudder B WHNP-BC, 3:24 PM 05/22/2024

## 2024-06-08 ENCOUNTER — Ambulatory Visit

## 2024-07-06 ENCOUNTER — Ambulatory Visit

## 2024-07-09 ENCOUNTER — Other Ambulatory Visit: Payer: Self-pay

## 2024-07-09 DIAGNOSIS — N951 Menopausal and female climacteric states: Secondary | ICD-10-CM

## 2024-07-09 MED ORDER — ESTRADIOL 10 MCG VA TABS: 1.0000 | ORAL_TABLET | VAGINAL | 0 refills | Status: AC

## 2024-07-09 NOTE — Telephone Encounter (Signed)
 Fax received from pharmacy stating that patient has transferred pharmacies but they did not receive the prescription transfer. Requesting a new script be sent to them.  Previous pharmacy - [facility]  New pharmacy - [facility]   Med refill request: estradiol   Last AEX: last OV 05/22/24 Next AEX: not scheduled  Last MMG (if hormonal med) 01/13/20 [facility] Cat 4 sus  Refill authorized: Please Advise?

## 2024-07-27 ENCOUNTER — Ambulatory Visit
Admission: RE | Admit: 2024-07-27 | Discharge: 2024-07-27 | Disposition: A | Source: Ambulatory Visit | Attending: Internal Medicine | Admitting: Internal Medicine

## 2024-07-27 DIAGNOSIS — Z1231 Encounter for screening mammogram for malignant neoplasm of breast: Secondary | ICD-10-CM

## 2024-08-03 ENCOUNTER — Other Ambulatory Visit: Payer: Self-pay | Admitting: Internal Medicine

## 2024-08-03 DIAGNOSIS — R928 Other abnormal and inconclusive findings on diagnostic imaging of breast: Secondary | ICD-10-CM

## 2024-08-19 ENCOUNTER — Encounter

## 2024-08-19 ENCOUNTER — Other Ambulatory Visit

## 2024-09-02 ENCOUNTER — Ambulatory Visit
Admission: RE | Admit: 2024-09-02 | Discharge: 2024-09-02 | Disposition: A | Discharge status: Home / Self Care | Source: Ambulatory Visit | Attending: Internal Medicine | Admitting: Internal Medicine

## 2024-09-02 ENCOUNTER — Ambulatory Visit

## 2024-09-02 DIAGNOSIS — R928 Other abnormal and inconclusive findings on diagnostic imaging of breast: Secondary | ICD-10-CM
